# Patient Record
Sex: Male | Born: 1995 | Race: Black or African American | Hispanic: No | Marital: Single | Smoking: Never smoker
Health system: Southern US, Community
[De-identification: ages and names within clinical notes are randomized; demographics above are authoritative.]

---
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
## 2015-05-22 ENCOUNTER — Emergency Department
Admission: EM | Admit: 2015-05-22 | Discharge: 2015-05-22 | Disposition: A | Discharge status: Home / Self Care | Payer: Self-pay | Attending: Emergency Medicine | Admitting: Emergency Medicine

## 2015-05-22 ENCOUNTER — Emergency Department: Payer: Self-pay

## 2015-05-22 ENCOUNTER — Encounter: Payer: Self-pay | Admitting: *Deleted

## 2015-05-22 DIAGNOSIS — S66912A Strain of unspecified muscle, fascia and tendon at wrist and hand level, left hand, initial encounter: Secondary | ICD-10-CM | POA: Insufficient documentation

## 2015-05-22 DIAGNOSIS — R202 Paresthesia of skin: Secondary | ICD-10-CM | POA: Insufficient documentation

## 2015-05-22 DIAGNOSIS — W228XXA Striking against or struck by other objects, initial encounter: Secondary | ICD-10-CM | POA: Insufficient documentation

## 2015-05-22 DIAGNOSIS — Y998 Other external cause status: Secondary | ICD-10-CM | POA: Insufficient documentation

## 2015-05-22 DIAGNOSIS — Y92003 Bedroom of unspecified non-institutional (private) residence as the place of occurrence of the external cause: Secondary | ICD-10-CM | POA: Insufficient documentation

## 2015-05-22 DIAGNOSIS — Y9389 Activity, other specified: Secondary | ICD-10-CM | POA: Insufficient documentation

## 2015-05-22 MED ORDER — IBUPROFEN 600 MG PO TABS
600.0000 mg | ORAL_TABLET | Freq: Four times a day (QID) | ORAL | Status: DC | PRN
Start: 2015-05-22 — End: 2015-11-05

## 2015-05-22 MED ORDER — IBUPROFEN 600 MG PO TABS
600.0000 mg | ORAL_TABLET | Freq: Once | ORAL | Status: AC
  Administered 2015-05-22: 600 mg via ORAL
  Filled 2015-05-22: qty 1

## 2015-05-22 NOTE — ED Notes (Signed)
Patient states he punched his bedroom wall with his left hand

## 2015-05-22 NOTE — ED Provider Notes (Signed)
[facility] Emergency Department Provider Note ____________________________________________  Time seen: Approximately 9:45 PM  I have reviewed the triage vital signs and the nursing notes.   HISTORY  Chief Complaint Hand Injury    HPI Vernon Higgins is a 20 y.o. male who reports to the emergency department for evaluation of left hand pain. He states that he punched his bedroom wall and is now having pain in his wrist, numbness and tingling in his index finger, and pain in his wrist if he turns his hand over.He denies previous fracture. He reviewed reports he is right-handed. He has not taken anything for pain since the incident.  History reviewed. No pertinent past medical history.  There are no active problems to display for this patient.   History reviewed. No pertinent past surgical history.  Current Outpatient Rx  Name  Route  Sig  Dispense  Refill  . ibuprofen (ADVIL,MOTRIN) 600 MG tablet   Oral   Take 1 tablet (600 mg total) by mouth every 6 (six) hours as needed.   30 tablet   0     Allergies Review of patient's allergies indicates no known allergies.  No family history on file.  Social History Social History  Substance Use Topics  . Smoking status: Never Smoker   . Smokeless tobacco: None  . Alcohol Use: No    Review of Systems Constitutional: No recent illness. Musculoskeletal: Pain in Left wrist and fingers. Skin: Negative for laceration, abrasion, or contusion. Neurological: Negative for headaches, focal weakness or numbness. Positive for paresthesia of the left index finger.  ____________________________________________   PHYSICAL EXAM:  VITAL SIGNS: ED Triage Vitals  Enc Vitals Group     BP 05/22/15 2120 110/94 mmHg     Pulse Rate 05/22/15 2120 78     Resp --      Temp 05/22/15 2120 98.3 F (36.8 C)     Temp Source 05/22/15 2120 Oral     SpO2 05/22/15 2120 100 %     Weight --      Height --      Head Cir --      Peak Flow --      Pain Score 05/22/15 2121 8     Pain Loc --      Pain Edu? --      Excl. in GC? --     Constitutional: Alert and oriented. Well appearing and in no acute distress. Eyes: Conjunctivae are normal. EOMI. Head: Atraumatic. Neck: No stridor.  Respiratory: Normal respiratory effort.   Musculoskeletal: Tenderness over the volar aspect of the ulnar styloid area. Tenderness over the dorsal aspect of the distal radius. Normal range of motion of fingers of the left hand. No obvious deformity of the left wrist or hand. Neurologic:  Normal speech and language. No gross focal neurologic deficits are appreciated. Speech is normal. No gait instability. Skin:  Skin is warm, dry and intact. Atraumatic. Psychiatric: Mood and affect are normal. Speech and behavior are normal.  ____________________________________________   LABS (all labs ordered are listed, but only abnormal results are displayed)  Labs Reviewed - No data to display ____________________________________________  RADIOLOGY  Left hand and wrist negative for fracture. I, Vernon Higgins, personally viewed and evaluated these images (plain radiographs) as part of my medical decision making, as well as reviewing the written report by the radiologist.  ____________________________________________   PROCEDURES  Procedure(s) performed:  Velcro wrist splint applied by RN. Neurovascularly intact post application.   ____________________________________________  INITIAL IMPRESSION / ASSESSMENT AND PLAN / ED COURSE  Pertinent labs & imaging results that were available during my care of the patient were reviewed by me and considered in my medical decision making (see chart for details).  Patient and mother were advised to follow-up with orthopedics for symptoms that are not improving over the next 7 days. They were advised to give ibuprofen 600 mg every 6 hours as needed for pain. There were advised to return to the  emergency department for symptoms that change or worsen if they're unable schedule an appointment. ____________________________________________   FINAL CLINICAL IMPRESSION(S) / ED DIAGNOSES  Final diagnoses:  Wrist strain, left, initial encounter  Left hand paresthesia       Vernon Pester, FNP 05/22/15 2247  Rockne Menghini, MD 05/23/15 1844

## 2015-11-05 ENCOUNTER — Emergency Department
Admission: EM | Admit: 2015-11-05 | Discharge: 2015-11-05 | Disposition: A | Discharge status: Home / Self Care | Payer: Self-pay | Attending: Emergency Medicine | Admitting: Emergency Medicine

## 2015-11-05 ENCOUNTER — Emergency Department: Payer: Self-pay

## 2015-11-05 ENCOUNTER — Encounter: Payer: Self-pay | Admitting: Emergency Medicine

## 2015-11-05 DIAGNOSIS — Z87891 Personal history of nicotine dependence: Secondary | ICD-10-CM | POA: Insufficient documentation

## 2015-11-05 DIAGNOSIS — R1013 Epigastric pain: Secondary | ICD-10-CM

## 2015-11-05 DIAGNOSIS — K5900 Constipation, unspecified: Secondary | ICD-10-CM | POA: Insufficient documentation

## 2015-11-05 DIAGNOSIS — K219 Gastro-esophageal reflux disease without esophagitis: Secondary | ICD-10-CM | POA: Insufficient documentation

## 2015-11-05 LAB — COMPREHENSIVE METABOLIC PANEL
ALK PHOS: 48 U/L (ref 38–126)
ALT: 26 U/L (ref 17–63)
ANION GAP: 4 — AB (ref 5–15)
AST: 22 U/L (ref 15–41)
Albumin: 4.1 g/dL (ref 3.5–5.0)
BUN: 13 mg/dL (ref 6–20)
CALCIUM: 8.9 mg/dL (ref 8.9–10.3)
CO2: 28 mmol/L (ref 22–32)
Chloride: 106 mmol/L (ref 101–111)
Creatinine, Ser: 0.95 mg/dL (ref 0.61–1.24)
GFR calc non Af Amer: >60 mL/min (ref >60)
Glucose, Bld: 94 mg/dL (ref 65–99)
POTASSIUM: 3.9 mmol/L (ref 3.5–5.1)
Sodium: 138 mmol/L (ref 135–145)
TOTAL PROTEIN: 6.9 g/dL (ref 6.5–8.1)
Total Bilirubin: 0.4 mg/dL (ref 0.3–1.2)

## 2015-11-05 LAB — CBC
HEMATOCRIT: 41.9 % (ref 40.0–52.0)
HEMOGLOBIN: 14.7 g/dL (ref 13.0–18.0)
MCH: 31.5 pg (ref 26.0–34.0)
MCHC: 35.2 g/dL (ref 32.0–36.0)
MCV: 89.4 fL (ref 80.0–100.0)
Platelets: 236 10*3/uL (ref 150–440)
RBC: 4.69 MIL/uL (ref 4.40–5.90)
RDW: 12.7 % (ref 11.5–14.5)
WBC: 7.5 10*3/uL (ref 3.8–10.6)

## 2015-11-05 LAB — LIPASE, BLOOD: Lipase: 28 U/L (ref 11–51)

## 2015-11-05 LAB — TROPONIN I

## 2015-11-05 MED ORDER — RANITIDINE HCL 150 MG/10ML PO SYRP: 150.0000 mg | ORAL_SOLUTION | Freq: Two times a day (BID) | ORAL | 0 refills | Status: AC

## 2015-11-05 MED ORDER — ONDANSETRON 4 MG PO TBDP
4.0000 mg | ORAL_TABLET | Freq: Once | ORAL | Status: AC
  Administered 2015-11-05: 4 mg via ORAL
  Filled 2015-11-05: qty 1

## 2015-11-05 MED ORDER — GI COCKTAIL ~~LOC~~
30.0000 mL | Freq: Once | ORAL | Status: AC
  Administered 2015-11-05: 30 mL via ORAL
  Filled 2015-11-05: qty 30

## 2015-11-05 MED ORDER — POLYETHYLENE GLYCOL 3350 17 G PO PACK: 17.0000 g | PACK | Freq: Every day | ORAL | 0 refills | Status: AC

## 2015-11-05 NOTE — ED Provider Notes (Signed)
[facility] Emergency Department Provider Note        Time seen: ----------------------------------------- 9:30 AM on 11/05/2015 -----------------------------------------    I have reviewed the triage vital signs and the nursing notes.   HISTORY  Chief Complaint Abdominal Pain    HPI Vernon Higgins is a 20 y.o. male who presents to ER for upper abdominal pain. Patientstates swallowing anything seems to worsen his symptoms. He denies fevers or chills, denies diarrhea. Patient states he's not had these types of problems before. Denies recent alcohol or change in diet.   History reviewed. No pertinent past medical history.  There are no active problems to display for this patient.   History reviewed. No pertinent surgical history.  Allergies Review of patient's allergies indicates no known allergies.  Social History Social History  Substance Use Topics  . Smoking status: Former Games developer  . Smokeless tobacco: Not on file  . Alcohol use No    Review of Systems Constitutional: Negative for fever. Cardiovascular: Negative for chest pain. Respiratory: Negative for shortness of breath. Gastrointestinal: Positive for abdominal pain, nausea Genitourinary: Negative for dysuria. Musculoskeletal: Negative for back pain. Skin: Negative for rash. Neurological: Negative for headaches, focal weakness or numbness.  10-point ROS otherwise negative.  ____________________________________________   PHYSICAL EXAM:  VITAL SIGNS: ED Triage Vitals [11/05/15 0751]  Enc Vitals Group     BP 121/64     Pulse Rate (!) 53     Resp 18     Temp 97.9 F (36.6 C)     Temp Source Oral     SpO2 100 %     Weight 160 lb (72.6 kg)     Height 5\' 7"  (1.702 m)     Head Circumference      Peak Flow      Pain Score      Pain Loc      Pain Edu?      Excl. in GC?     Constitutional: Alert and oriented. Well appearing and in no distress. Eyes: Conjunctivae are  normal. PERRL. Normal extraocular movements. ENT   Head: Normocephalic and atraumatic.   Nose: No congestion/rhinnorhea.   Mouth/Throat: Mucous membranes are moist.   Neck: No stridor. Cardiovascular: Normal rate, regular rhythm. No murmurs, rubs, or gallops. Respiratory: Normal respiratory effort without tachypnea nor retractions. Breath sounds are clear and equal bilaterally. No wheezes/rales/rhonchi. Gastrointestinal: Epigastric tenderness, no rebound or guarding. Normal bowel sounds. Musculoskeletal: Nontender with normal range of motion in all extremities. No lower extremity tenderness nor edema. Neurologic:  Normal speech and language. No gross focal neurologic deficits are appreciated.  Skin:  Skin is warm, dry and intact. No rash noted. Psychiatric: Mood and affect are normal. Speech and behavior are normal.  ____________________________________________  ED COURSE:  Pertinent labs & imaging results that were available during my care of the patient were reviewed by me and considered in my medical decision making (see chart for details). Clinical Course  Patient presents to ER distress, we will assess with basic labs, give GI cocktail and reevaluate.  Procedures ____________________________________________   LABS (pertinent positives/negatives)  Labs Reviewed  COMPREHENSIVE METABOLIC PANEL - Abnormal; Notable for the following:       Result Value   Anion gap 4 (*)    All other components within normal limits  LIPASE, BLOOD  CBC  TROPONIN I    RADIOLOGY  Abdomen 2 view IMPRESSION: 1. Moderate stool burden within the colon which may reflect underlying constipation.  ____________________________________________  FINAL ASSESSMENT AND PLAN  Abdominal pain  Plan: Patient with labs and imaging as dictated above. Patient's no distress, I will start him on Pepcid for GERD and MiraLAX for constipation. He is stable for outpatient follow-up with his  doctor.   Emily FilbertWilliams, Clement Deneault E, MD   Note: This dictation was prepared with Dragon dictation. Any transcriptional errors that result from this process are unintentional    Emily FilbertJonathan E Opha Mcghee, MD 11/05/15 1047

## 2016-08-26 ENCOUNTER — Emergency Department
Admission: EM | Admit: 2016-08-26 | Discharge: 2016-08-26 | Disposition: A | Discharge status: Home / Self Care | Payer: Self-pay | Attending: Emergency Medicine | Admitting: Emergency Medicine

## 2016-08-26 ENCOUNTER — Encounter: Payer: Self-pay | Admitting: Emergency Medicine

## 2016-08-26 DIAGNOSIS — R55 Syncope and collapse: Secondary | ICD-10-CM

## 2016-08-26 DIAGNOSIS — Z87891 Personal history of nicotine dependence: Secondary | ICD-10-CM | POA: Insufficient documentation

## 2016-08-26 LAB — CBC
HEMATOCRIT: 46.8 % (ref 40.0–52.0)
HEMOGLOBIN: 16.2 g/dL (ref 13.0–18.0)
MCH: 31 pg (ref 26.0–34.0)
MCHC: 34.6 g/dL (ref 32.0–36.0)
MCV: 89.7 fL (ref 80.0–100.0)
Platelets: 250 10*3/uL (ref 150–440)
RBC: 5.22 MIL/uL (ref 4.40–5.90)
RDW: 12.8 % (ref 11.5–14.5)
WBC: 13.7 10*3/uL — ABNORMAL HIGH (ref 3.8–10.6)

## 2016-08-26 LAB — BASIC METABOLIC PANEL
ANION GAP: 10 (ref 5–15)
BUN: 18 mg/dL (ref 6–20)
CALCIUM: 9.9 mg/dL (ref 8.9–10.3)
CHLORIDE: 105 mmol/L (ref 101–111)
CO2: 25 mmol/L (ref 22–32)
Creatinine, Ser: 1.18 mg/dL (ref 0.61–1.24)
GFR calc non Af Amer: >60 mL/min (ref >60)
GLUCOSE: 91 mg/dL (ref 65–99)
POTASSIUM: 3.9 mmol/L (ref 3.5–5.1)
Sodium: 140 mmol/L (ref 135–145)

## 2016-08-26 LAB — GLUCOSE, CAPILLARY: GLUCOSE-CAPILLARY: 71 mg/dL (ref 65–99)

## 2016-08-26 MED ORDER — IBUPROFEN 800 MG PO TABS
800.0000 mg | ORAL_TABLET | Freq: Once | ORAL | Status: AC
  Administered 2016-08-26: 800 mg via ORAL
  Filled 2016-08-26: qty 1

## 2016-08-26 MED ORDER — SODIUM CHLORIDE 0.9 % IV BOLUS (SEPSIS)
1000.0000 mL | Freq: Once | INTRAVENOUS | Status: AC
  Administered 2016-08-26: 1000 mL via INTRAVENOUS

## 2016-08-26 NOTE — ED Notes (Addendum)
Near syncopal episode while at work. Pt works in Designer, industrial/producthigh heat environment. Pt alert and oriented X4, active, cooperative, pt in NAD. RR even and unlabored, color WNL.    EMS initiated IV and gave pt 500cc of fluid.

## 2016-08-26 NOTE — ED Provider Notes (Signed)
[facility] Emergency Department Provider Note ____________________________________________   I have reviewed the triage vital signs and the triage nursing note.  HISTORY  Chief Complaint Near Syncope   Historian Patient and brother Emergency planning/management officer(manager with him at work)  HPI Vernon Higgins is a 21 y.o. male no significant past medical history presents today from his place of work which is essentially away her house which is an air-conditioned where he was feeling very hot and over heated and then started feeling lightheaded and dizzy and nearly passed out. No true loss of consciousness. Brother who is also his manager was there and was pouring water on him and ice packs. At this point he is feeling much better and was initially. No chest pain or trouble breathing. He did have some vomiting on Saturday, couple of days ago, but no ongoing feeling of nausea or diarrhea. No tick bites or fevers. No focal weakness or numbness. No headache. No chest pain. No abdominal pain.  Symptoms are now significantly improved.    History reviewed. No pertinent past medical history.  There are no active problems to display for this patient.   History reviewed. No pertinent surgical history.  Prior to Admission medications   Medication Sig Start Date End Date Taking? Authorizing Provider  polyethylene glycol (MIRALAX) packet Take 17 g by mouth daily. Patient not taking: Reported on 08/26/2016 11/05/15   Emily FilbertWilliams, Jonathan E, MD  ranitidine (ZANTAC) 150 MG/10ML syrup Take 10 mLs (150 mg total) by mouth 2 (two) times daily. Patient not taking: Reported on 08/26/2016 11/05/15   Emily FilbertWilliams, Jonathan E, MD    No Known Allergies  History reviewed. No pertinent family history.  Social History Social History  Substance Use Topics  . Smoking status: Former Games developermoker  . Smokeless tobacco: Never Used  . Alcohol use No    Review of Systems  Constitutional: Negative for fever. Eyes: Negative for  visual changes. ENT: Negative for sore throat. Cardiovascular: Negative for chest pain. Respiratory: Negative for shortness of breath. Gastrointestinal: Nausea and vomiting on Saturday, none today. Genitourinary: Negative for dysuria. Musculoskeletal: Negative for back pain. Skin: Negative for rash. Neurological: Negative for headache.  ____________________________________________   PHYSICAL EXAM:  VITAL SIGNS: ED Triage Vitals  Enc Vitals Group     BP 08/26/16 1000 (!) 148/75     Pulse Rate 08/26/16 1000 63     Resp 08/26/16 1000 (!) 9     Temp 08/26/16 1001 98.2 F (36.8 C)     Temp Source 08/26/16 1001 Oral     SpO2 08/26/16 1000 100 %     Weight 08/26/16 1003 155 lb (70.3 kg)     Height 08/26/16 1003 5' 7.5" (1.715 m)     Head Circumference --      Peak Flow --      Pain Score --      Pain Loc --      Pain Edu? --      Excl. in GC? --      Constitutional: Alert and oriented. Well appearing and in no distress. HEENT   Head: Normocephalic and atraumatic.      Eyes: Conjunctivae are normal. Pupils equal and round.       Ears:         Nose: No congestion/rhinnorhea.   Mouth/Throat: Mucous membranes are mildly dry.   Neck: No stridor. Cardiovascular/Chest: Normal rate, regular rhythm.  No murmurs, rubs, or gallops. Respiratory: Normal respiratory effort without tachypnea nor retractions. Breath  sounds are clear and equal bilaterally. No wheezes/rales/rhonchi. Gastrointestinal: Soft. No distention, no guarding, no rebound. Nontender.    Genitourinary/rectal:Deferred Musculoskeletal: Nontender with normal range of motion in all extremities. No joint effusions.  No lower extremity tenderness.  No edema. Neurologic:  Normal speech and language. No gross or focal neurologic deficits are appreciated. Skin:  Skin is warm, dry and intact. No rash noted. Psychiatric: Mood and affect are normal. Speech and behavior are normal. Patient exhibits appropriate insight and  judgment.   ____________________________________________  LABS (pertinent positives/negatives)  Labs Reviewed  CBC - Abnormal; Notable for the following:       Result Value   WBC 13.7 (*)    All other components within normal limits  BASIC METABOLIC PANEL  GLUCOSE, CAPILLARY  URINALYSIS, COMPLETE (UACMP) WITH MICROSCOPIC  URINE DRUG SCREEN, QUALITATIVE ([facility] ONLY)  CBG MONITORING, ED    ____________________________________________    EKG I, Governor Rooks, MD, the attending physician have personally viewed and interpreted all ECGs.  60 bpm. Normal sinus rhythm. Narrow QRS. Normal axis. Normal ST and T-wave ____________________________________________  RADIOLOGY All Xrays were viewed by me. Imaging interpreted by Radiologist.  None __________________________________________  PROCEDURES  Procedure(s) performed: None  Critical Care performed: None  ____________________________________________   ED COURSE / ASSESSMENT AND PLAN  Pertinent labs & imaging results that were available during my care of the patient were reviewed by me and considered in my medical decision making (see chart for details).   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 His vital signs are stable here and is feeling better now. I am going go ahead and give him a liter fluid given the fact that he looks a little dehydrated clinically. His laboratory studies are reassuring.  He had been a little ill a couple days ago with vomiting and although he felt that it subsided, it's possible this made him a little unclear/susceptible to overheating and presyncope.  No high risk features in terms of full syncope, EKG looks reassuring, and the chest pain.  Patient feels improved I am going to go ahead and discharge home and refer her for outpatient follow-up.  Patient did not want to stay to provide urine sample adult tick that this is  unreasonable, is not having symptoms a year urine tract infection. I discussed that if he is using any sort of drugs that could make him more susceptible to presyncope in the setting of overheating.  CONSULTATIONS:  None  Patient / Family / Caregiver informed of clinical course, medical decision-making process, and agree with plan.   I discussed return precautions, follow-up instructions, and discharge instructions with patient and/or family.  Discharge Instructions:  You were evaluated after nearly passing out in the setting of peak over heated. Make sure that you are drinking plenty of water and taking breaks to be cooled down when you working in the heat.  Please follow up with primary care doctor within 1 week. Next an excellent return to the emergency department immediately for any new or worsening symptoms including any chest pain, palpitations, irregular heartbeat, weakness, numbness, confusion altered mental status, or any other symptoms concerning to you.   ___________________________________________   FINAL CLINICAL IMPRESSION(S) / ED DIAGNOSES   Final diagnoses:  Near syncope              Note: This dictation was prepared with Dragon dictation. Any transcriptional errors that result from this process are unintentional  Governor Rooks, MD 08/26/16 1159

## 2016-08-26 NOTE — ED Triage Notes (Signed)
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 EMS reports that pt was working, felt sweaty and had near-syncopal episode, as well as tingling in hands and feet. Warehouse personnel had ice packs on pt to cool him off when ems arrived. EMS also reports irregular HR on EKG. Pt states that did not drink alcohol or do anything out of the ordinary last night and felt normal upon awakening this morning. Pt alert & oriented with NAD noted.  EMS inserted 20g iv catheter in left ac; gave 500 LR en route.

## 2016-08-26 NOTE — Discharge Instructions (Signed)
You were evaluated after nearly passing out in the setting of peak over heated. Make sure that you are drinking plenty of water and taking breaks to be "down when you working in the heat.  Please follow up with primary care doctor within 1 week. Next an excellent return to the emergency department immediately for any new or worsening symptoms including any chest pain, palpitations, irregular heartbeat, weakness, numbness, confusion altered mental status, or any other symptoms concerning to you.

## 2016-11-06 ENCOUNTER — Emergency Department
Admission: EM | Admit: 2016-11-06 | Discharge: 2016-11-06 | Disposition: A | Discharge status: Home / Self Care | Payer: Self-pay | Attending: Emergency Medicine | Admitting: Emergency Medicine

## 2016-11-06 DIAGNOSIS — Y990 Civilian activity done for income or pay: Secondary | ICD-10-CM | POA: Insufficient documentation

## 2016-11-06 DIAGNOSIS — Y9389 Activity, other specified: Secondary | ICD-10-CM | POA: Insufficient documentation

## 2016-11-06 DIAGNOSIS — Z87891 Personal history of nicotine dependence: Secondary | ICD-10-CM | POA: Insufficient documentation

## 2016-11-06 DIAGNOSIS — S46911A Strain of unspecified muscle, fascia and tendon at shoulder and upper arm level, right arm, initial encounter: Secondary | ICD-10-CM | POA: Insufficient documentation

## 2016-11-06 DIAGNOSIS — T148XXA Other injury of unspecified body region, initial encounter: Secondary | ICD-10-CM

## 2016-11-06 DIAGNOSIS — X509XXA Other and unspecified overexertion or strenuous movements or postures, initial encounter: Secondary | ICD-10-CM | POA: Insufficient documentation

## 2016-11-06 DIAGNOSIS — Y9289 Other specified places as the place of occurrence of the external cause: Secondary | ICD-10-CM | POA: Insufficient documentation

## 2016-11-06 MED ORDER — IBUPROFEN 100 MG/5ML PO SUSP: 200.0000 mg | Freq: Four times a day (QID) | ORAL | 0 refills | Status: DC | PRN

## 2016-11-06 MED ORDER — ORPHENADRINE CITRATE 30 MG/ML IJ SOLN
60.0000 mg | Freq: Two times a day (BID) | INTRAMUSCULAR | Status: DC
  Administered 2016-11-06: 60 mg via INTRAMUSCULAR
  Filled 2016-11-06: qty 2

## 2016-11-06 MED ORDER — CYCLOBENZAPRINE HCL 5 MG PO TABS: 5.0000 mg | ORAL_TABLET | Freq: Three times a day (TID) | ORAL | 0 refills | Status: AC | PRN

## 2016-11-06 MED ORDER — KETOROLAC TROMETHAMINE 30 MG/ML IJ SOLN
30.0000 mg | Freq: Once | INTRAMUSCULAR | Status: AC
  Administered 2016-11-06: 30 mg via INTRAVENOUS
  Filled 2016-11-06: qty 1

## 2016-11-06 NOTE — ED Provider Notes (Signed)
[facility] Emergency Department Provider Note  ____________________________________________  Time seen: Approximately 12:03 PM  I have reviewed the triage vital signs and the nursing notes.   HISTORY  Chief Complaint Shoulder Pain and Wrist Pain    HPI Vernon Higgins is a 21 y.o. male that presents to the emergency department with right shoulder pain. 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 Pain is primarily in the back of his shoulder. It feels like a pulled muscle and a burning sensation. He does not have any pain when sitting still. No trauma. He is requesting a work note for today and tomorrow. No neck pain, shortness of breath, chest pain, numbness, tingling.   History reviewed. No pertinent past medical history.  There are no active problems to display for this patient.   History reviewed. No pertinent surgical history.  Prior to Admission medications   Medication Sig Start Date End Date Taking? Authorizing Provider  cyclobenzaprine (FLEXERIL) 5 MG tablet Take 1 tablet (5 mg total) by mouth 3 (three) times daily as needed for muscle spasms. 11/06/16 11/13/16  Enid Derry, PA-C  ibuprofen (ADVIL,MOTRIN) 100 MG/5ML suspension Take 10 mLs (200 mg total) by mouth every 6 (six) hours as needed. 11/06/16   Enid Derry, PA-C  polyethylene glycol [facility]) packet Take 17 g by mouth daily. Patient not taking: Reported on 08/26/2016 11/05/15   Emily Filbert, MD  ranitidine (ZANTAC) 150 MG/10ML syrup Take 10 mLs (150 mg total) by mouth 2 (two) times daily. Patient not taking: Reported on 08/26/2016 11/05/15   Emily Filbert, MD    Allergies Patient has no known allergies.  No family history on file.  Social History Social History  Substance Use Topics  . Smoking status: Former Games developer  . Smokeless tobacco: Never Used  . Alcohol use No     Review of Systems  Constitutional: No fever/chills Cardiovascular: No chest  pain. Respiratory: No SOB. Gastrointestinal: No abdominal pain.  No nausea, no vomiting.  Musculoskeletal: Positive for shoulder pain. Skin: Negative for rash, abrasions, lacerations, ecchymosis. Neurological: Negative for headaches, numbness or tingling   ____________________________________________   PHYSICAL EXAM:  VITAL SIGNS: ED Triage Vitals [11/06/16 1107]  Enc Vitals Group     BP 118/70     Pulse Rate (!) 56     Resp 14     Temp 97.7 F (36.5 C)     Temp Source Oral     SpO2 100 %     Weight 170 lb (77.1 kg)     Height  (1.702 m)     Head Circumference      Peak Flow      Pain Score 0     Pain Loc      Pain Edu?      Excl. in GC?      Constitutional: Alert and oriented. Well appearing and in no acute distress. Eyes: Conjunctivae are normal. PERRL. EOMI. Head: Atraumatic. ENT:      Ears:      Nose: No congestion/rhinnorhea.      Mouth/Throat: Mucous membranes are moist.  Neck: No stridor.  Cardiovascular: Normal rate, regular rhythm.  Good peripheral circulation. 2+ radial pulses. Respiratory: Normal respiratory effort without tachypnea or retractions. Lungs CTAB. Good air entry to the bases with no decreased or absent breath sounds. Musculoskeletal: Full range of motion to all extremities. No gross deformities appreciated. No tenderness to palpation over right shoulder. Strength 5 out of 5 in  shoulder. Sensation intact. Neurologic:  Normal speech and language. No gross focal neurologic deficits are appreciated.  Skin:  Skin is warm, dry and intact. No rash noted.   ____________________________________________   LABS (all labs ordered are listed, but only abnormal results are displayed)  Labs Reviewed - No data to display ____________________________________________  EKG   ____________________________________________  RADIOLOGY  No results found.  ____________________________________________    PROCEDURES  Procedure(s) performed:     Procedures    Medications  orphenadrine (NORFLEX) injection 60 mg (60 mg Intramuscular Given 11/06/16 1233)  ketorolac (TORADOL) 30 MG/ML injection 30 mg (30 mg Intravenous Given 11/06/16 1233)     ____________________________________________   INITIAL IMPRESSION / ASSESSMENT AND PLAN / ED COURSE  Pertinent labs & imaging results that were available during my care of the patient were reviewed by me and considered in my medical decision making (see chart for details).  Review of the [place] CSRS was performed in accordance of the [facility] prior to dispensing any controlled drugs.    Patient presented to the emergency department for right shoulder pain. Symptoms are consistent with an overuse injury. Vital signs and exam are reassuring. Patient states that he is here for a work note to rest his shoulder. Patient will be discharged home with prescriptions for ibuprofen and Flexeril. Patient is to follow up with PCP as directed. Patient is given ED precautions to return to the ED for any worsening or new symptoms.     ____________________________________________  FINAL CLINICAL IMPRESSION(S) / ED DIAGNOSES  Final diagnoses:  Muscle strain      NEW MEDICATIONS STARTED DURING THIS VISIT:  New Prescriptions   CYCLOBENZAPRINE (FLEXERIL) 5 MG TABLET    Take 1 tablet (5 mg total) by mouth 3 (three) times daily as needed for muscle spasms.   IBUPROFEN (ADVIL,MOTRIN) 100 MG/5ML SUSPENSION    Take 10 mLs (200 mg total) by mouth every 6 (six) hours as needed.        This chart was dictated using voice recognition software/Dragon. Despite best efforts to proofread, errors can occur which can change the meaning. Any change was purely unintentional.    Enid DerryWagner, Juanito Gonyer, PA-C 11/06/16 1324    Minna AntisPaduchowski, Kevin, MD 11/06/16 1555

## 2016-11-06 NOTE — ED Triage Notes (Signed)
Says pain right shouler and left wrist with movement.  Denies injurye

## 2016-11-06 NOTE — ED Notes (Signed)
See triage note  presents with pain to right shoulder and left wrist  W/o known injury.  States the discomfort to shoulder started yesterday and he reached back and popped his shoulder   Which made it feel better  But at work today states pain returned

## 2017-03-09 ENCOUNTER — Emergency Department
Admission: EM | Admit: 2017-03-09 | Discharge: 2017-03-09 | Disposition: A | Discharge status: Home / Self Care | Payer: Self-pay | Attending: Emergency Medicine | Admitting: Emergency Medicine

## 2017-03-09 ENCOUNTER — Encounter: Payer: Self-pay | Admitting: Emergency Medicine

## 2017-03-09 ENCOUNTER — Other Ambulatory Visit: Payer: Self-pay

## 2017-03-09 DIAGNOSIS — Z87891 Personal history of nicotine dependence: Secondary | ICD-10-CM | POA: Insufficient documentation

## 2017-03-09 DIAGNOSIS — M654 Radial styloid tenosynovitis [de Quervain]: Secondary | ICD-10-CM | POA: Insufficient documentation

## 2017-03-09 MED ORDER — KETOROLAC TROMETHAMINE 30 MG/ML IJ SOLN
30.0000 mg | Freq: Once | INTRAMUSCULAR | Status: AC
  Administered 2017-03-09: 30 mg via INTRAMUSCULAR
  Filled 2017-03-09: qty 1

## 2017-03-09 MED ORDER — IBUPROFEN 800 MG PO TABS: 800.0000 mg | ORAL_TABLET | Freq: Three times a day (TID) | ORAL | 0 refills | Status: DC | PRN

## 2017-03-09 NOTE — ED Provider Notes (Signed)
[facility] Emergency Department Provider Note  ____________________________________________  Time seen: Approximately 10:20 AM  I have reviewed the triage vital signs and the nursing notes.   HISTORY  Chief Complaint Hand Pain    HPI Vernon Higgins is a 22 y.o. male that presents emergency department for evaluation of right thumb pain for 1 week.  Pain is over the outside of his thumb near his wrist.  It is worse with movement of his thumb.  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  He tried ice and Advil last night, which did not help.  No trauma.  No swelling, numbness, tingling.   History reviewed. No pertinent past medical history.  There are no active problems to display for this patient.   History reviewed. No pertinent surgical history.  Prior to Admission medications   Medication Sig Start Date End Date Taking? Authorizing Provider  ibuprofen (ADVIL,MOTRIN) 800 MG tablet Take 1 tablet (800 mg total) by mouth every 8 (eight) hours as needed. 03/09/17   Enid DerryWagner, Enmanuel Zufall, PA-C  polyethylene glycol [facility](MIRALAX) packet Take 17 g by mouth daily. Patient not taking: Reported on 08/26/2016 11/05/15   Emily FilbertWilliams, Jonathan E, MD  ranitidine (ZANTAC) 150 MG/10ML syrup Take 10 mLs (150 mg total) by mouth 2 (two) times daily. Patient not taking: Reported on 08/26/2016 11/05/15   Emily FilbertWilliams, Jonathan E, MD    Allergies Patient has no known allergies.  No family history on file.  Social History Social History   Tobacco Use  . Smoking status: Former Games developermoker  . Smokeless tobacco: Never Used  Substance Use Topics  . Alcohol use: No  . Drug use: Not on file     Review of Systems  Cardiovascular: No chest pain. Respiratory: No SOB. Gastrointestinal: No nausea, no vomiting.  Musculoskeletal: Positive for thumb pain. Skin: Negative for rash, abrasions, lacerations, ecchymosis. Neurological: Negative for numbness or  tingling   ____________________________________________   PHYSICAL EXAM:  VITAL SIGNS: ED Triage Vitals  Enc Vitals Group     BP 03/09/17 0939 131/69     Pulse Rate 03/09/17 0939 (!) 59     Resp 03/09/17 0939 16     Temp 03/09/17 0939 97.8 F (36.6 C)     Temp Source 03/09/17 0939 Oral     SpO2 03/09/17 0939 100 %     Weight 03/09/17 0938 180 lb (81.6 kg)     Height 03/09/17 0938 5\' 7"  (1.702 m)     Head Circumference --      Peak Flow --      Pain Score 03/09/17 0938 6     Pain Loc --      Pain Edu? --      Excl. in GC? --      Constitutional: Alert and oriented. Well appearing and in no acute distress. Eyes: Conjunctivae are normal. PERRL. EOMI. Head: Atraumatic. ENT:      Ears:      Nose: No congestion/rhinnorhea.      Mouth/Throat: Mucous membranes are moist.  Neck: No stridor.   Cardiovascular: Good peripheral circulation.  Symmetric radial pulses bilaterally. Respiratory: Normal respiratory effort without tachypnea or retractions.  Good air entry to the bases with no decreased or absent breath sounds. Musculoskeletal: Full range of motion to all extremities. No gross deformities appreciated.  No tenderness to palpation over wrist.  Grip strength intact.  Positive Finkelstein's test.  No swelling or erythema. Neurologic:  Normal speech and language. No  gross focal neurologic deficits are appreciated.  Skin:  Skin is warm, dry and intact. No rash noted.   ____________________________________________   LABS (all labs ordered are listed, but only abnormal results are displayed)  Labs Reviewed - No data to display ____________________________________________  EKG   ____________________________________________  RADIOLOGY  No results found.  ____________________________________________    PROCEDURES  Procedure(s) performed:    Procedures    Medications  ketorolac (TORADOL) 30 MG/ML injection 30 mg (30 mg Intramuscular Given 03/09/17 1026)      ____________________________________________   INITIAL IMPRESSION / ASSESSMENT AND PLAN / ED COURSE  Pertinent labs & imaging results that were available during my care of the patient were reviewed by me and considered in my medical decision making (see chart for details).  Review of the [place] CSRS was performed in accordance of the [facility] prior to dispensing any controlled drugs.   Patient's diagnosis is consistent with de Quervain's.  Vital signs and exam are reassuring.  Patient was given a dose of Toradol.  Wrist splint was placed placed.  Patient will be discharged home with prescriptions for ibuprofen. Patient is to follow up with PCP as directed. Patient is given ED precautions to return to the ED for any worsening or new symptoms.     ____________________________________________  FINAL CLINICAL IMPRESSION(S) / ED DIAGNOSES  Final diagnoses:  De Quervain's tenosynovitis      NEW MEDICATIONS STARTED DURING THIS VISIT:  ED Discharge Orders        Ordered    ibuprofen (ADVIL,MOTRIN) 800 MG tablet  Every 8 hours PRN     03/09/17 1025          This chart was dictated using voice recognition software/Dragon. Despite best efforts to proofread, errors can occur which can change the meaning. Any change was purely unintentional.    Enid Derry, PA-C 03/09/17 1203    Jene Every, MD 03/09/17 1229

## 2017-03-09 NOTE — ED Notes (Addendum)
Right wrist and thumb pain for the past week, pt states he may have hit hand at work and states from overusing hand thumb continues to to hurt down through the wrist.  Pt states he has put ice on it and took a dose of Advil for home treatment.

## 2017-03-09 NOTE — ED Triage Notes (Signed)
C/O right hand pain x 10 days.  Unsure of injury, states pain worse when working.

## 2017-11-24 ENCOUNTER — Other Ambulatory Visit: Payer: Self-pay

## 2017-11-24 DIAGNOSIS — Z79899 Other long term (current) drug therapy: Secondary | ICD-10-CM | POA: Insufficient documentation

## 2017-11-24 DIAGNOSIS — Z87891 Personal history of nicotine dependence: Secondary | ICD-10-CM | POA: Insufficient documentation

## 2017-11-24 DIAGNOSIS — K219 Gastro-esophageal reflux disease without esophagitis: Secondary | ICD-10-CM | POA: Insufficient documentation

## 2017-11-24 LAB — COMPREHENSIVE METABOLIC PANEL
ALT: 12 U/L (ref 0–44)
ANION GAP: 7 (ref 5–15)
AST: 18 U/L (ref 15–41)
Albumin: 4.2 g/dL (ref 3.5–5.0)
Alkaline Phosphatase: 39 U/L (ref 38–126)
BUN: 13 mg/dL (ref 6–20)
CO2: 25 mmol/L (ref 22–32)
CREATININE: 0.93 mg/dL (ref 0.61–1.24)
Calcium: 8.9 mg/dL (ref 8.9–10.3)
Chloride: 107 mmol/L (ref 98–111)
Glucose, Bld: 124 mg/dL — ABNORMAL HIGH (ref 70–99)
Potassium: 3.9 mmol/L (ref 3.5–5.1)
SODIUM: 139 mmol/L (ref 135–145)
Total Bilirubin: 0.5 mg/dL (ref 0.3–1.2)
Total Protein: 6.6 g/dL (ref 6.5–8.1)

## 2017-11-24 LAB — CBC
HCT: 40.6 % (ref 40.0–52.0)
Hemoglobin: 14.5 g/dL (ref 13.0–18.0)
MCH: 33.1 pg (ref 26.0–34.0)
MCHC: 35.7 g/dL (ref 32.0–36.0)
MCV: 92.7 fL (ref 80.0–100.0)
PLATELETS: 245 10*3/uL (ref 150–440)
RBC: 4.37 MIL/uL — AB (ref 4.40–5.90)
RDW: 13.2 % (ref 11.5–14.5)
WBC: 7.8 10*3/uL (ref 3.8–10.6)

## 2017-11-24 LAB — LIPASE, BLOOD: LIPASE: 36 U/L (ref 11–51)

## 2017-11-24 NOTE — ED Triage Notes (Signed)
Pt in with co epigastric pain and vomiting after he eats. States started Friday, states pain is like a burning that worsens after he eats.

## 2017-11-25 ENCOUNTER — Emergency Department: Payer: Self-pay

## 2017-11-25 ENCOUNTER — Emergency Department
Admission: EM | Admit: 2017-11-25 | Discharge: 2017-11-25 | Disposition: A | Discharge status: Home / Self Care | Payer: Self-pay | Attending: Emergency Medicine | Admitting: Emergency Medicine

## 2017-11-25 DIAGNOSIS — K219 Gastro-esophageal reflux disease without esophagitis: Secondary | ICD-10-CM

## 2017-11-25 LAB — URINALYSIS, COMPLETE (UACMP) WITH MICROSCOPIC
Bilirubin Urine: NEGATIVE
Glucose, UA: NEGATIVE mg/dL
Hgb urine dipstick: NEGATIVE
KETONES UR: NEGATIVE mg/dL
Leukocytes, UA: NEGATIVE
NITRITE: NEGATIVE
PROTEIN: NEGATIVE mg/dL
Specific Gravity, Urine: 1.018 (ref 1.005–1.030)
pH: 7 (ref 5.0–8.0)

## 2017-11-25 LAB — TROPONIN I: Troponin I: <0.03 ng/mL (ref <0.03)

## 2017-11-25 MED ORDER — RANITIDINE HCL 150 MG/10ML PO SYRP
150.0000 mg | ORAL_SOLUTION | Freq: Once | ORAL | Status: DC
  Filled 2017-11-25: qty 10

## 2017-11-25 MED ORDER — RANITIDINE HCL 300 MG PO TABS: 300.0000 mg | ORAL_TABLET | Freq: Every day | ORAL | 0 refills | Status: DC

## 2017-11-25 MED ORDER — PANTOPRAZOLE SODIUM 40 MG PO TBEC
DELAYED_RELEASE_TABLET | ORAL | Status: AC
  Administered 2017-11-25: 40 mg via ORAL
  Filled 2017-11-25: qty 1

## 2017-11-25 MED ORDER — PANTOPRAZOLE SODIUM 40 MG PO TBEC
40.0000 mg | DELAYED_RELEASE_TABLET | Freq: Once | ORAL | Status: AC
  Administered 2017-11-25: 40 mg via ORAL

## 2017-11-25 NOTE — ED Provider Notes (Addendum)
[facility] Emergency Department Provider Note    First MD Initiated Contact with Patient 11/25/17 0117     (approximate)  I have reviewed the triage vital signs and the nursing notes.   HISTORY  Chief Complaint Abdominal Pain    HPI Vernon Higgins is a 22 y.o. male resents to the emergency department with epigastric abdominal pain which patient states is worsened with eating. Patient also admits to vomiting after each meal. She describes the pain as burning. Patient denies any pain at present.   Past medical history None There are no active problems to display for this patient.   Past surgical history None  Prior to Admission medications   Medication Sig Start Date End Date Taking? Authorizing Provider  ibuprofen (ADVIL,MOTRIN) 800 MG tablet Take 1 tablet (800 mg total) by mouth every 8 (eight) hours as needed. 03/09/17   Enid Derry, PA-C  polyethylene glycol [facility]) packet Take 17 g by mouth daily. Patient not taking: Reported on 08/26/2016 11/05/15   Emily Filbert, MD  ranitidine (ZANTAC) 150 MG/10ML syrup Take 10 mLs (150 mg total) by mouth 2 (two) times daily. Patient not taking: Reported on 08/26/2016 11/05/15   Emily Filbert, MD    Allergies No known drug allergies No family history on file.  Social History Social History   Tobacco Use  . Smoking status: Former Games developer  . Smokeless tobacco: Never Used  Substance Use Topics  . Alcohol use: No  . Drug use: Not on file    Review of Systems Constitutional: No fever/chills Eyes: No visual changes. ENT: No sore throat. Cardiovascular: Denies chest pain. Respiratory: Denies shortness of breath. Gastrointestinal: Positive for burning abdominal pain.  No nausea, no vomiting.  No diarrhea.  No constipation. Genitourinary: Negative for dysuria. Musculoskeletal: Negative for neck pain.  Negative for back pain. Integumentary: Negative for rash. Neurological: Negative for  headaches, focal weakness or numbness.  ____________________________________________   PHYSICAL EXAM:  VITAL SIGNS: ED Triage Vitals  Enc Vitals Group     BP 11/24/17 2141 117/77     Pulse Rate 11/24/17 2141 (!) 53     Resp 11/24/17 2141 20     Temp 11/24/17 2141 98.4 F (36.9 C)     Temp Source 11/24/17 2141 Oral     SpO2 11/24/17 2141 100 %     Weight 11/24/17 2140 81.6 kg (180 lb)     Height 11/24/17 2140 1.702 m (5\' 7" )     Head Circumference --      Peak Flow --      Pain Score 11/24/17 2140 7     Pain Loc --      Pain Edu? --      Excl. in GC? --     Constitutional: Alert and oriented. Well appearing and in no acute distress. Eyes: Conjunctivae are normal. Mouth/Throat: Mucous membranes are moist.Oropharynx non-erythematous. Neck: No stridor.  Cardiovascular: Normal rate, regular rhythm. Good peripheral circulation. Grossly normal heart sounds. Respiratory: Normal respiratory effort.  No retractions. Lungs CTAB. Gastrointestinal: Soft and nontender. No distention.  Musculoskeletal: No lower extremity tenderness nor edema. No gross deformities of extremities. Neurologic:  Normal speech and language. No gross focal neurologic deficits are appreciated.  Skin:  Skin is warm, dry and intact. No rash noted.   ____________________________________________   LABS (all labs ordered are listed, but only abnormal results are displayed)  Labs Reviewed  CBC - Abnormal; Notable for the following components:  Result Value   RBC 4.37 (*)    All other components within normal limits  COMPREHENSIVE METABOLIC PANEL - Abnormal; Notable for the following components:   Glucose, Bld 124 (*)    All other components within normal limits  URINALYSIS, COMPLETE (UACMP) WITH MICROSCOPIC - Abnormal; Notable for the following components:   Color, Urine YELLOW (*)    APPearance CLOUDY (*)    Bacteria, UA FEW (*)    All other components within normal limits  LIPASE, BLOOD  TROPONIN  I   ____________________________________________   RADIOLOGY I, Sy[place] N Kaytie Ratcliffe, personally viewed and evaluated these images (plain radiographs) as part of my medical decision making, as well as reviewing the written report by the radiologist.  ED MD interpretation: No evidence of acute cardiopulmonary disease per radiologist  Official radiology report(s): Dg Chest Portable 1 View  Result Date: 11/25/2017 CLINICAL DATA:  Epigastric/chest pain, vomiting after eating EXAM: PORTABLE CHEST 1 VIEW COMPARISON:  03/06/2008 FINDINGS: Lungs are clear.  No pleural effusion or pneumothorax. The heart is normal in size. IMPRESSION: No evidence of acute cardiopulmonary disease. Electronically Signed   By: Charline Bills M.D.   On: 11/25/2017 01:06        Procedures   ____________________________________________   INITIAL IMPRESSION / ASSESSMENT AND PLAN / ED COURSE  As part of my medical decision making, I reviewed the following data within the electronic MEDICAL RECORD NUMBER   22 year old male presenting with above-stated history and physical exam of burning epigastric pain radiating to the chest.  Suspect possible gastritis, optic ulcer versus GERD as the etiology for the patient's discomfort patient has no pain at present.  Patient will be prescribed Zantac for home with recommendation to follow-up with gastroenterology.    ____________________________________________  FINAL CLINICAL IMPRESSION(S) / ED DIAGNOSES  Final diagnoses:  Gastroesophageal reflux disease, esophagitis presence not specified     MEDICATIONS GIVEN DURING THIS VISIT:  Medications - No data to display   ED Discharge Orders    None       Note:  This document was prepared using Dragon voice recognition software and may include unintentional dictation errors.    Darci Current, MD 11/25/17 1610    Darci Current, MD 12/09/17 [phone]

## 2017-11-25 NOTE — ED Notes (Signed)
Pt uprite on stretcher in exam room with no distress noted; pt reports since Friday having midsternal, upper "burning" CP accomp by N/V; pt placed on card monitor for EKG and troponin added to previous collection; resp even/unlab, lungs clear, apical audible & regular, +BS, abd soft/nondist/nontender

## 2018-01-05 ENCOUNTER — Emergency Department: Payer: Self-pay

## 2018-01-05 ENCOUNTER — Encounter: Payer: Self-pay | Admitting: Emergency Medicine

## 2018-01-05 ENCOUNTER — Emergency Department
Admission: EM | Admit: 2018-01-05 | Discharge: 2018-01-05 | Disposition: A | Discharge status: Home / Self Care | Payer: Self-pay | Attending: Emergency Medicine | Admitting: Emergency Medicine

## 2018-01-05 DIAGNOSIS — J069 Acute upper respiratory infection, unspecified: Secondary | ICD-10-CM | POA: Insufficient documentation

## 2018-01-05 DIAGNOSIS — Z87891 Personal history of nicotine dependence: Secondary | ICD-10-CM | POA: Insufficient documentation

## 2018-01-05 LAB — URINALYSIS, COMPLETE (UACMP) WITH MICROSCOPIC
Bacteria, UA: NONE SEEN
Bilirubin Urine: NEGATIVE
GLUCOSE, UA: NEGATIVE mg/dL
HGB URINE DIPSTICK: NEGATIVE
Ketones, ur: NEGATIVE mg/dL
Leukocytes, UA: NEGATIVE
NITRITE: NEGATIVE
PH: 6 (ref 5.0–8.0)
Protein, ur: NEGATIVE mg/dL
Specific Gravity, Urine: 1.015 (ref 1.005–1.030)
Squamous Epithelial / LPF: NONE SEEN (ref 0–5)

## 2018-01-05 LAB — CBC
HCT: 43 % (ref 39.0–52.0)
HEMOGLOBIN: 14.9 g/dL (ref 13.0–17.0)
MCH: 31.6 pg (ref 26.0–34.0)
MCHC: 34.7 g/dL (ref 30.0–36.0)
MCV: 91.3 fL (ref 80.0–100.0)
PLATELETS: 267 10*3/uL (ref 150–400)
RBC: 4.71 MIL/uL (ref 4.22–5.81)
RDW: 11.9 % (ref 11.5–15.5)
WBC: 7.7 10*3/uL (ref 4.0–10.5)
nRBC: 0 % (ref 0.0–0.2)

## 2018-01-05 LAB — COMPREHENSIVE METABOLIC PANEL
ALBUMIN: 4.3 g/dL (ref 3.5–5.0)
ALK PHOS: 45 U/L (ref 38–126)
ALT: 12 U/L (ref 0–44)
ANION GAP: 8 (ref 5–15)
AST: 17 U/L (ref 15–41)
BILIRUBIN TOTAL: 0.7 mg/dL (ref 0.3–1.2)
BUN: 11 mg/dL (ref 6–20)
CALCIUM: 9.3 mg/dL (ref 8.9–10.3)
CO2: 27 mmol/L (ref 22–32)
Chloride: 105 mmol/L (ref 98–111)
Creatinine, Ser: 0.76 mg/dL (ref 0.61–1.24)
GFR calc Af Amer: >60 mL/min (ref >60)
GLUCOSE: 85 mg/dL (ref 70–99)
POTASSIUM: 4 mmol/L (ref 3.5–5.1)
Sodium: 140 mmol/L (ref 135–145)
TOTAL PROTEIN: 7 g/dL (ref 6.5–8.1)

## 2018-01-05 LAB — LIPASE, BLOOD: Lipase: 38 U/L (ref 11–51)

## 2018-01-05 LAB — TROPONIN I: Troponin I: <0.03 ng/mL (ref <0.03)

## 2018-01-05 MED ORDER — ONDANSETRON 4 MG PO TBDP: 4.0000 mg | ORAL_TABLET | Freq: Three times a day (TID) | ORAL | 0 refills | Status: DC | PRN

## 2018-01-05 NOTE — ED Triage Notes (Signed)
Pt reports feeling weak for the last 3 days with some abd pain, chest pain, nausea, vomiting and weakness.

## 2018-01-05 NOTE — ED Provider Notes (Signed)
[facility] Emergency Department Provider Note  Time seen: 9:42 PM  I have reviewed the triage vital signs and the nursing notes.   HISTORY  Chief Complaint Weakness; Emesis; Nausea; Chest Pain; and Abdominal Pain    HPI Vernon Higgins is a 22 y.o. male with no past medical history who presents emergency department for 1 week of cough, congestion, muscle aches, nausea and vomiting.  According to the patient for the past 1 week he has had a cough and congestion, occasional with nausea vomiting and upper abdominal discomfort which she relates to the vomiting.  Denies any known fever.  Does state some muscle aching/body aches.  Patient states try to go to work but vomited to he came to the emergency department for evaluation.   History reviewed. No pertinent past medical history.  There are no active problems to display for this patient.   History reviewed. No pertinent surgical history.  Prior to Admission medications   Medication Sig Start Date End Date Taking? Authorizing Provider  ibuprofen (ADVIL,MOTRIN) 800 MG tablet Take 1 tablet (800 mg total) by mouth every 8 (eight) hours as needed. 03/09/17   Enid DerryWagner, Ashley, PA-C  polyethylene glycol [facility](MIRALAX) packet Take 17 g by mouth daily. Patient not taking: Reported on 08/26/2016 11/05/15   Emily FilbertWilliams, Jonathan E, MD  ranitidine (ZANTAC) 150 MG/10ML syrup Take 10 mLs (150 mg total) by mouth 2 (two) times daily. Patient not taking: Reported on 08/26/2016 11/05/15   Emily FilbertWilliams, Jonathan E, MD  ranitidine (ZANTAC) 300 MG tablet Take 1 tablet (300 mg total) by mouth at bedtime. 11/25/17 12/25/17  Darci CurrentBrown, Pajonal N, MD    No Known Allergies  No family history on file.  Social History Social History   Tobacco Use  . Smoking status: Former Games developermoker  . Smokeless tobacco: Never Used  Substance Use Topics  . Alcohol use: No  . Drug use: Not on file    Review of Systems Constitutional: Negative for fever. ENT: Positive for  congestion x1 week Cardiovascular: Negative for chest pain. Respiratory: Negative for shortness of breath.  Positive for cough. Gastrointestinal: Intermittent upper abdominal discomfort.  Occasional nausea and vomiting.  Negative for diarrhea Genitourinary: Negative for urinary compaints Musculoskeletal: Negative for musculoskeletal complaints Skin: Negative for skin complaints  Neurological: Negative for headache All other ROS negative  ____________________________________________   PHYSICAL EXAM:  VITAL SIGNS: ED Triage Vitals [01/05/18 2051]  Enc Vitals Group     BP 126/75     Pulse Rate 73     Resp 20     Temp 97.8 F (36.6 C)     Temp Source Oral     SpO2 100 %     Weight 195 lb (88.5 kg)     Height 5\' 7"  (1.702 m)     Head Circumference      Peak Flow      Pain Score 8     Pain Loc      Pain Edu?      Excl. in GC?     Constitutional: Alert and oriented. Well appearing and in no distress. Eyes: Normal exam ENT   Head: Normocephalic and atraumatic.   Nose: Mild congestion   Mouth/Throat: Mucous membranes are moist. Cardiovascular: Normal rate, regular rhythm. No murmur Respiratory: Normal respiratory effort without tachypnea nor retractions. Breath sounds are clear Gastrointestinal: Soft and nontender. No distention.  Musculoskeletal: Nontender with normal range of motion in all extremities.  Neurologic:  Normal speech and language. No  gross focal neurologic deficits  Skin:  Skin is warm, dry and intact.  Psychiatric: Mood and affect are normal.   ____________________________________________    EKG  EKG shows a normal sinus rhythm at 62 bpm with a narrow QRS, normal axis, normal intervals, no concerning ST changes.  ____________________________________________    RADIOLOGY  Chest x-ray is negative  ____________________________________________   INITIAL IMPRESSION / ASSESSMENT AND PLAN / ED COURSE  Pertinent labs & imaging results that  were available during my care of the patient were reviewed by me and considered in my medical decision making (see chart for details).  Patient presents to the emergency department for 1 week of cough, congestion, now with intermittent nausea and vomiting and some upper abdominal discomfort.  Overall the patient appears well benign abdominal exam, clear lung sounds, normal vitals.  Patient's lab work, chest x-ray and EKG are reassuring.  Overall the patient appears very well.  Suspect likely viral upper respiratory infection we will discharge with nausea medication.  Instructed the patient to use Tylenol ibuprofen as written on the box as needed for discomfort, along with plenty of rest and fluids.  Patient agreeable to plan of care.  ____________________________________________   FINAL CLINICAL IMPRESSION(S) / ED DIAGNOSES  Upper respiratory infection    Minna Antis, MD 01/05/18 2145

## 2018-07-30 LAB — HM HEPATITIS C SCREENING LAB: HM Hepatitis Screen: NEGATIVE

## 2018-07-30 LAB — HM HIV SCREENING LAB: HM HIV Screening: NEGATIVE

## 2018-12-07 ENCOUNTER — Ambulatory Visit: Payer: Self-pay | Admitting: Physician Assistant

## 2018-12-07 ENCOUNTER — Other Ambulatory Visit: Payer: Self-pay

## 2018-12-07 ENCOUNTER — Encounter: Payer: Self-pay | Admitting: Physician Assistant

## 2018-12-07 DIAGNOSIS — Z113 Encounter for screening for infections with a predominantly sexual mode of transmission: Secondary | ICD-10-CM

## 2018-12-07 LAB — GRAM STAIN

## 2018-12-07 NOTE — Progress Notes (Signed)
    STI clinic/screening visit  Subjective:  Vernon Higgins is a 23 y.o. male being seen today for an STI screening visit. The patient reports they do not have symptoms.  Patient has the following medical conditions:  There are no active problems to display for this patient.    Chief Complaint  Patient presents with  . SEXUALLY TRANSMITTED DISEASE    HPI  Patient reports that he does not have any symptoms and would like a screening today.    See flowsheet for further details and programmatic requirements.    The following portions of the patient's history were reviewed and updated as appropriate: allergies, current medications, past medical history, past social history, past surgical history and problem list.  Objective:  There were no vitals filed for this visit.  Physical Exam Constitutional:      General: He is not in acute distress.    Appearance: Normal appearance.  HENT:     Head: Normocephalic and atraumatic.     Mouth/Throat:     Mouth: Mucous membranes are moist.     Pharynx: No oropharyngeal exudate or posterior oropharyngeal erythema.  Eyes:     Conjunctiva/sclera: Conjunctivae normal.  Neck:     Musculoskeletal: Neck supple.  Pulmonary:     Effort: Pulmonary effort is normal.  Abdominal:     Palpations: Abdomen is soft. There is no mass.     Tenderness: There is no abdominal tenderness. There is no guarding or rebound.  Genitourinary:    Penis: Normal.      Scrotum/Testes: Normal.     Comments: Pubic area without nits, lice, edema, erythema, lesions and inguinal adenopathy. Penis circumcised and without discharge at meatus. Lymphadenopathy:     Cervical: No cervical adenopathy.  Skin:    General: Skin is warm and dry.     Findings: No bruising, erythema, lesion or rash.  Neurological:     Mental Status: He is alert and oriented to person, place, and time.  Psychiatric:        Mood and Affect: Mood normal.        Behavior: Behavior normal.      Thought Content: Thought content normal.        Judgment: Judgment normal.       Assessment and Plan:  Vernon Higgins is a 23 y.o. male presenting to the [facility] Department for STI screening  1. Screening for STD (sexually transmitted disease) Patient into clinic without symptoms. Rec condoms with all sex. Await test results.  Counseled that RN will call if needs to RTC for treatment once results are back. - Gram stain - HIV [place] [facility] - Syphilis Serology, [place] [facility] - Gonococcus culture     No follow-ups on file.  No future appointments.  Jerene Dilling, PA

## 2018-12-12 LAB — GONOCOCCUS CULTURE

## 2019-01-18 ENCOUNTER — Ambulatory Visit: Payer: Medicaid Other | Admitting: Physician Assistant

## 2019-01-18 ENCOUNTER — Other Ambulatory Visit: Payer: Self-pay

## 2019-01-18 DIAGNOSIS — R198 Other specified symptoms and signs involving the digestive system and abdomen: Secondary | ICD-10-CM

## 2019-01-18 DIAGNOSIS — Z113 Encounter for screening for infections with a predominantly sexual mode of transmission: Secondary | ICD-10-CM

## 2019-01-18 DIAGNOSIS — N341 Nonspecific urethritis: Secondary | ICD-10-CM

## 2019-01-18 LAB — GRAM STAIN

## 2019-01-18 MED ORDER — AZITHROMYCIN 500 MG PO TABS: 500.0000 mg | ORAL_TABLET | Freq: Once | ORAL | Status: DC

## 2019-01-18 MED ORDER — AZITHROMYCIN 500 MG PO TABS: 1000.0000 mg | ORAL_TABLET | Freq: Once | ORAL | Status: DC

## 2019-01-18 MED ORDER — AZITHROMYCIN 1 G PO PACK: 1.0000 g | PACK | Freq: Once | ORAL | 0 refills | Status: AC

## 2019-01-19 ENCOUNTER — Encounter: Payer: Self-pay | Admitting: Physician Assistant

## 2019-01-19 DIAGNOSIS — R198 Other specified symptoms and signs involving the digestive system and abdomen: Secondary | ICD-10-CM | POA: Insufficient documentation

## 2019-01-19 NOTE — Progress Notes (Signed)
STI clinic/screening visit  Subjective:  Vernon Higgins is a 23 y.o. male being seen today for an STI screening visit. The patient reports they do not have symptoms.    Patient has the following medical conditions:  There are no active problems to display for this patient.    Chief Complaint  Patient presents with  . SEXUALLY TRANSMITTED DISEASE    HPI  Patient reports that he is not having symptoms but would like a screening.  Denies chronic conditions and history of surgery.  See flowsheet for further details and programmatic requirements.    The following portions of the patient's history were reviewed and updated as appropriate: allergies, current medications, past medical history, past social history, past surgical history and problem list.  Objective:  There were no vitals filed for this visit.  Physical Exam Constitutional:      General: He is not in acute distress.    Appearance: Normal appearance.  HENT:     Head: Normocephalic and atraumatic.     Comments: No nits, lice, or hair loss. No cervical, supraclavicular or axillary adenopathy.    Mouth/Throat:     Mouth: Mucous membranes are moist.     Pharynx: Oropharynx is clear. No oropharyngeal exudate or posterior oropharyngeal erythema.  Eyes:     Conjunctiva/sclera: Conjunctivae normal.  Neck:     Musculoskeletal: Neck supple.  Pulmonary:     Effort: Pulmonary effort is normal.  Abdominal:     Palpations: Abdomen is soft. There is no mass.     Tenderness: There is no abdominal tenderness. There is no guarding or rebound.  Genitourinary:    Penis: Normal.      Scrotum/Testes: Normal.     Comments: Pubic area without nits, lice, edema, erythema, lesions and inguinal adenopathy. Penis circumcised and without discharge at meatus. Lymphadenopathy:     Cervical: No cervical adenopathy.  Skin:    General: Skin is warm and dry.     Findings: No bruising, erythema, lesion or rash.  Neurological:   Mental Status: He is alert and oriented to person, place, and time.  Psychiatric:        Mood and Affect: Mood normal.        Behavior: Behavior normal.        Thought Content: Thought content normal.        Judgment: Judgment normal.       Assessment and Plan:  Vernon Higgins is a 23 y.o. male presenting to the [facility] Department for STI screening  1. Screening for STD (sexually transmitted disease) Patient into clinic for screening and without symptoms. Rec condoms with all sex.  Await test results.  Counseled that RN will call if needs to RTC for further treatment once results are back.  - Gram stain - HIV [place] [facility] - Syphilis Serology,  Lab - Gonococcus culture  2. 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  Attempted to treat patient with DOT therapy, but patient unable to swallow pills and spit them out in trash.  Patient requested Rx for powder that he can take in applesauce or mix with water and handwritten Rx given to patient for same. No sex for 7 days and until after partner completes treatment. Call clinic if not able to keep down medicine for another Rx. - azithromycin (ZITHROMAX) 1 g powder; Take 1 packet (1 g total) by mouth once for 1 dose.  Dispense: 1 each;  Refill: 0     Return if symptoms worsen or fail to improve.  No future appointments.  Jerene Dilling, PA

## 2019-01-22 LAB — GONOCOCCUS CULTURE

## 2019-05-30 ENCOUNTER — Emergency Department: Payer: Medicaid Other

## 2019-05-30 ENCOUNTER — Other Ambulatory Visit: Payer: Self-pay

## 2019-05-30 ENCOUNTER — Emergency Department
Admission: EM | Admit: 2019-05-30 | Discharge: 2019-05-30 | Disposition: A | Discharge status: Home / Self Care | Payer: Medicaid Other | Attending: Emergency Medicine | Admitting: Emergency Medicine

## 2019-05-30 DIAGNOSIS — X509XXA Other and unspecified overexertion or strenuous movements or postures, initial encounter: Secondary | ICD-10-CM | POA: Insufficient documentation

## 2019-05-30 DIAGNOSIS — S43005A Unspecified dislocation of left shoulder joint, initial encounter: Secondary | ICD-10-CM

## 2019-05-30 DIAGNOSIS — S43015A Anterior dislocation of left humerus, initial encounter: Secondary | ICD-10-CM | POA: Insufficient documentation

## 2019-05-30 DIAGNOSIS — Y999 Unspecified external cause status: Secondary | ICD-10-CM | POA: Insufficient documentation

## 2019-05-30 DIAGNOSIS — Y929 Unspecified place or not applicable: Secondary | ICD-10-CM | POA: Insufficient documentation

## 2019-05-30 DIAGNOSIS — Z87891 Personal history of nicotine dependence: Secondary | ICD-10-CM | POA: Insufficient documentation

## 2019-05-30 DIAGNOSIS — Y9371 Activity, boxing: Secondary | ICD-10-CM | POA: Insufficient documentation

## 2019-05-30 MED ORDER — BUPIVACAINE HCL 0.25 % IJ SOLN
5.0000 mL | Freq: Once | INTRAMUSCULAR | Status: AC
  Administered 2019-05-30: 5 mL
  Filled 2019-05-30: qty 5

## 2019-05-30 MED ORDER — IBUPROFEN 600 MG PO TABS: 600.0000 mg | ORAL_TABLET | Freq: Three times a day (TID) | ORAL | 0 refills | Status: AC | PRN

## 2019-05-30 MED ORDER — HYDROMORPHONE HCL 1 MG/ML IJ SOLN
1.0000 mg | Freq: Once | INTRAMUSCULAR | Status: AC
  Administered 2019-05-30: 1 mg via INTRAVENOUS
  Filled 2019-05-30: qty 1

## 2019-05-30 MED ORDER — HYDROCODONE-ACETAMINOPHEN 5-325 MG PO TABS: 1.0000 | ORAL_TABLET | Freq: Four times a day (QID) | ORAL | 0 refills | Status: DC | PRN

## 2019-05-30 MED ORDER — LIDOCAINE HCL (PF) 1 % IJ SOLN
5.0000 mL | Freq: Once | INTRAMUSCULAR | Status: AC
  Administered 2019-05-30: 5 mL via INTRADERMAL
  Filled 2019-05-30: qty 5

## 2019-05-30 NOTE — ED Provider Notes (Signed)
[facility] Emergency Department Provider Note  ____________________________________________   First MD Initiated Contact with Patient 05/30/19 2146     (approximate)  I have reviewed the triage vital signs and the nursing notes.   HISTORY  Chief Complaint Dislocation    HPI Vernon Higgins is a 24 y.o. male  Here with left shoulder pain. Pt was boxing today when he punched with his left arm, reports immediate onset of severe pain and deformity to shoulder. Pain is aching, throbbing, severe, worse w/ any movement. He has a history of dislocation in the past, did not follow-up with an orthopedist. No direct trauma. No numbness, weakness. No direct trauma.        History reviewed. No pertinent past medical history.  Patient Active Problem List   Diagnosis Date Noted  . Difficulty swallowing pills 01/19/2019    History reviewed. No pertinent surgical history.  Prior to Admission medications   Medication Sig Start Date End Date Taking? Authorizing Provider  HYDROcodone-acetaminophen (NORCO/VICODIN) 5-325 MG tablet Take 1 tablet by mouth every 6 (six) hours as needed for severe pain. 05/30/19 05/29/20  Shaune Pollack, MD  ibuprofen (ADVIL) 600 MG tablet Take 1 tablet (600 mg total) by mouth every 8 (eight) hours as needed for moderate pain. 05/30/19   Shaune Pollack, MD  polyethylene glycol [facility]) packet Take 17 g by mouth daily. Patient not taking: Reported on 08/26/2016 11/05/15   Emily Filbert, MD  ranitidine (ZANTAC) 150 MG/10ML syrup Take 10 mLs (150 mg total) by mouth 2 (two) times daily. Patient not taking: Reported on 08/26/2016 11/05/15   Emily Filbert, MD    Allergies Patient has no known allergies.  No family history on file.  Social History Social History   Tobacco Use  . Smoking status: Former Games developer  . Smokeless tobacco: Never Used  Substance Use Topics  . Alcohol use: No  . Drug use: Yes    Types: Marijuana    Review  of Systems  Review of Systems  Constitutional: Negative for chills and fever.  HENT: Negative for sore throat.   Respiratory: Negative for shortness of breath.   Cardiovascular: Negative for chest pain.  Gastrointestinal: Negative for abdominal pain.  Genitourinary: Negative for flank pain.  Musculoskeletal: Positive for arthralgias. Negative for neck pain.  Skin: Negative for rash and wound.  Allergic/Immunologic: Negative for immunocompromised state.  Neurological: Negative for weakness and numbness.  Hematological: Does not bruise/bleed easily.     ____________________________________________  PHYSICAL EXAM:      VITAL SIGNS: ED Triage Vitals  Enc Vitals Group     BP 05/30/19 2041 130/85     Pulse Rate 05/30/19 2041 70     Resp 05/30/19 2041 18     Temp 05/30/19 2041 98.4 F (36.9 C)     Temp Source 05/30/19 2041 Oral     SpO2 05/30/19 2041 97 %     Weight 05/30/19 2042 180 lb (81.6 kg)     Height 05/30/19 2042 5\' 8"  (1.727 m)     Head Circumference --      Peak Flow --      Pain Score 05/30/19 2048 10     Pain Loc --      Pain Edu? --      Excl. in GC? --      Physical Exam Vitals and nursing note reviewed.  Constitutional:      General: He is not in acute distress.    Appearance:  He is well-developed.  HENT:     Head: Normocephalic and atraumatic.  Eyes:     Conjunctiva/sclera: Conjunctivae normal.  Cardiovascular:     Rate and Rhythm: Normal rate and regular rhythm.     Heart sounds: Normal heart sounds.  Pulmonary:     Effort: Pulmonary effort is normal. No respiratory distress.     Breath sounds: No wheezing.  Abdominal:     General: There is no distension.  Musculoskeletal:     Cervical back: Neck supple.  Skin:    General: Skin is warm.     Capillary Refill: Capillary refill takes less than 2 seconds.     Findings: No rash.  Neurological:     Mental Status: He is alert and oriented to person, place, and time.     Motor: No abnormal muscle  tone.      UPPER EXTREMITY EXAM: LEFT  INSPECTION & PALPATION: Obvious step off deformity of left shoulder with anterior dislocation. No open wounds.   SENSORY: Sensation is intact to light touch in:  Superficial radial nerve distribution (dorsal first web space) Median nerve distribution (tip of index finger)   Ulnar nerve distribution (tip of small finger)   Slight diminished sensation in axillary nerve dist.   MOTOR:  + Motor posterior interosseous nerve (thumb IP extension) + Anterior interosseous nerve (thumb IP flexion, index finger DIP flexion) + Radial nerve (wrist extension) + Median nerve (palpable firing thenar mass) + Ulnar nerve (palpable firing of first dorsal interosseous muscle)  VASCULAR: 2+ radial pulse Brisk capillary refill < 2 sec, fingers warm and well-perfused     ____________________________________________   LABS (all labs ordered are listed, but only abnormal results are displayed)  Labs Reviewed - No data to display  ____________________________________________  EKG:  ________________________________________  RADIOLOGY All imaging, including plain films, CT scans, and ultrasounds, independently reviewed by me, and interpretations confirmed via formal radiology reads.  ED MD interpretation:   XR Shoulder left: Anterior L shoulder dislocation XR Shoulder 2: Successful reduction, HIll-Sachs lesion noted  Official radiology report(s): DG Shoulder Left  Result Date: 05/30/2019 CLINICAL DATA:  Status post reduction. EXAM: LEFT SHOULDER - 2+ VIEW COMPARISON:  05/30/2019 FINDINGS: There is been interval reduction of the previously demonstrated glenohumeral dislocation. A Hill-Sachs lesion is noted. There is no evidence for a bony [place]. IMPRESSION: Interval reduction of the previously demonstrated glenohumeral dislocation with a Hill-Sachs lesion. Electronically Signed   By: Constance Holster M.D.   On: 05/30/2019 22:25   DG Shoulder  Left  Result Date: 05/30/2019 CLINICAL DATA:  Boxing injury, shoulder pain. EXAM: LEFT SHOULDER - 2+ VIEW COMPARISON:  None. FINDINGS: There is anterior dislocation at the left glenohumeral joint. No visible fracture. Soft tissues are intact. IMPRESSION: Anterior left shoulder dislocation. Electronically Signed   By: Rolm Baptise M.D.   On: 05/30/2019 21:08    ____________________________________________  PROCEDURES   Procedure(s) performed (including Critical Care):  .Ortho Injury Treatment  Date/Time: 05/31/2019 12:49 AM Performed by: Duffy Bruce, MD Authorized by: Duffy Bruce, MD   Consent:    Consent obtained:  Verbal   Consent given by:  Patient   Risks discussed:  Fracture, irreducible dislocation, nerve damage, recurrent dislocation, restricted joint movement, stiffness and vascular damage   Alternatives discussed:  Alternative treatmentInjury location: Left shoulder dislocation. Pre-procedure neurovascular assessment: neurovascularly intact Pre-procedure distal perfusion: normal Pre-procedure neurological function: normal Pre-procedure range of motion: reduced Anesthesia method: Intra-articular block.  Anesthesia: Local anesthesia used: yes Local anesthetic:  1:1 lidocaine:bupivicaine. Anesthetic total: 10 mL  Patient sedated: NoPost-procedure neurovascular assessment: post-procedure neurovascularly intact Post-procedure distal perfusion: normal Post-procedure neurological function: normal Post-procedure range of motion: improved Patient tolerance: patient tolerated the procedure well with no immediate complications Comments: Closed manual reduction achieved at bedside, tolerated well.     ____________________________________________  INITIAL IMPRESSION / MDM / ASSESSMENT AND PLAN / ED COURSE  As part of my medical decision making, I reviewed the following data within the electronic MEDICAL RECORD NUMBER Nursing notes reviewed and incorporated, Old chart  reviewed, Notes from prior ED visits, and [place] Controlled Substance Database       *Vernon Higgins was evaluated in Emergency Department on 05/31/2019 for the symptoms described in the history of present illness. He was evaluated in the context of the global COVID-19 pandemic, which necessitated consideration that the patient might be at risk for infection with the SARS-CoV-2 virus that causes COVID-19. Institutional protocols and algorithms that pertain to the evaluation of patients at risk for COVID-19 are in a state of rapid change based on information released by regulatory bodies including the CDC and federal and state organizations. These policies and algorithms were followed during the patient's care in the ED.  Some ED evaluations and interventions may be delayed as a result of limited staffing during the pandemic.*     Medical Decision Making:  24 yo M here with atraumatic left shoulder dislocation. History of same. Following informed consent, pt given dilaudid and local anesthesia with intra-articular injection of 10 cc of 1:1 lidocaine:bupivicaine. Pt tolerated procedure very well and shoulder was able to be reduced with just slight downward traction and external rotation. Concerned he is high risk for recurrent dislocation given the laxity on exam and ease of reduction. XR shows reduction w/ Tamera Reason. Will place in sling, refer for outpt ortho follow-up. Non-weight bearing discussed.   ____________________________________________  FINAL CLINICAL IMPRESSION(S) / ED DIAGNOSES  Final diagnoses:  Shoulder dislocation, left, initial encounter     MEDICATIONS GIVEN DURING THIS VISIT:  Medications  HYDROmorphone (DILAUDID) injection 1 mg (1 mg Intravenous Given 05/30/19 2152)  lidocaine (PF) (XYLOCAINE) 1 % injection 5 mL (5 mLs Intradermal Given by Other 05/30/19 2207)  bupivacaine (MARCAINE) 0.25 % (with pres) injection 5 mL (5 mLs Infiltration Given by Other 05/30/19 2208)     ED  Discharge Orders         Ordered    ibuprofen (ADVIL) 600 MG tablet  Every 8 hours PRN     05/30/19 2226    HYDROcodone-acetaminophen (NORCO/VICODIN) 5-325 MG tablet  Every 6 hours PRN     05/30/19 2232           Note:  This document was prepared using Dragon voice recognition software and may include unintentional dictation errors.   Shaune Pollack, MD 05/31/19 [phone]

## 2019-05-30 NOTE — Discharge Instructions (Addendum)
You should see an Orthopedist in the next 1-2 weeks for follow-up  Wear your sling for the next week, then gently return to normal activity. No heavy lifting > 15 lb for 2 weeks.

## 2019-05-30 NOTE — ED Triage Notes (Signed)
Pt arrives to ED c/o possible left arm dislocation. Pt states he was boxing and threw a punch and feels like he threw his left arm out. Pt states this has happened before. Abnormality noted.

## 2019-05-30 NOTE — ED Notes (Signed)
Pt left arm dislocated.

## 2019-10-24 ENCOUNTER — Encounter: Payer: Self-pay | Admitting: Family Medicine

## 2019-10-24 ENCOUNTER — Other Ambulatory Visit: Payer: Self-pay

## 2019-10-24 ENCOUNTER — Ambulatory Visit: Payer: Self-pay | Admitting: Family Medicine

## 2019-10-24 DIAGNOSIS — Z792 Long term (current) use of antibiotics: Secondary | ICD-10-CM

## 2019-10-24 DIAGNOSIS — Z113 Encounter for screening for infections with a predominantly sexual mode of transmission: Secondary | ICD-10-CM

## 2019-10-24 LAB — GRAM STAIN

## 2019-10-24 MED ORDER — DOXYCYCLINE HYCLATE 100 MG PO TABS: 100.0000 mg | ORAL_TABLET | Freq: Two times a day (BID) | ORAL | 0 refills | Status: AC

## 2019-10-24 MED ORDER — CEFTRIAXONE SODIUM 500 MG IJ SOLR
500.0000 mg | Freq: Once | INTRAMUSCULAR | Status: AC
  Administered 2019-10-24: 500 mg via INTRAMUSCULAR

## 2019-10-24 NOTE — Progress Notes (Signed)
   [facility] Department STI clinic/screening visit  Subjective:  Vernon Higgins is a 24 y.o. male being seen today for an STI screening visit. The patient reports they do have symptoms.    Patient has the following medical conditions:   Patient Active Problem List   Diagnosis Date Noted  . Difficulty swallowing pills 01/19/2019     Chief Complaint  Patient presents with  . SEXUALLY TRANSMITTED DISEASE    screening     HPI  Patient reports discomfort with urination x 5 days.  Patient denies any other symptoms.     See flowsheet for further details and programmatic requirements.    The following portions of the patient's history were reviewed and updated as appropriate: allergies, current medications, past medical history, past social history, past surgical history and problem list.  Objective:  There were no vitals filed for this visit.  Physical Exam Constitutional:      Appearance: Normal appearance.  HENT:     Head: Normocephalic.     Mouth/Throat:     Mouth: Mucous membranes are moist.     Pharynx: Oropharynx is clear.     Comments: dental caries present  Genitourinary:    Penis: Normal.      Testes: Normal.     Comments: No lice, nits, or pest, no lesions or odor.  Denies pain or tenderness with paplation of testicles.  No lesions, ulcers or masses present.  Swollen left inguinal lymph nodes, non-tender and mobile. Thick white discharge present at urethral opening.   Musculoskeletal:     Cervical back: Normal range of motion and neck supple.  Lymphadenopathy:     Cervical: No cervical adenopathy.  Skin:    General: Skin is warm and dry.     Findings: No bruising, erythema, lesion or rash.  Neurological:     Mental Status: He is alert.  Psychiatric:        Mood and Affect: Mood normal.        Behavior: Behavior normal.       Assessment and Plan:  Vernon Higgins is a 24 y.o. male presenting to the [facility] Department for STI  screening  1. Screening examination for venereal disease - Gram stain - HIV [place] [facility] - HBV Antigen/Antibody [facility] - Syphilis Serology, [place] - Gonococcus culture throat  - Gonococcus culture urethral   Per gram stain no treatment needed. Consulted with Maximiano Coss , PA.  Per Maximiano Coss, verbal order Patient treated prophylactically for chlamydia and gonorrhea d/t discharge on exam with Ceftriaxone 500 mg IM and Doxycycline 100 MG PO BID x 7 days.  Instructed patient no sex for next 7 days. Awaiting results of GC and blood work.  Will call patient if any results are positive.  Recommended condoms with all sex.        No follow-ups on file.  Future Appointments  Date Time Provider Department Center  10/24/2019 11:00 AM Wendi Snipes, RN [facility] None    Wendi Snipes, RN

## 2019-10-27 ENCOUNTER — Encounter: Payer: Self-pay | Admitting: Family Medicine

## 2019-10-27 LAB — HEPATITIS B SURFACE ANTIGEN

## 2019-10-29 LAB — GONOCOCCUS CULTURE

## 2019-11-02 ENCOUNTER — Encounter: Payer: Self-pay | Admitting: Family Medicine

## 2019-11-02 LAB — HM HIV SCREENING LAB: HM HIV Screening: NEGATIVE

## 2019-11-02 LAB — HM HEPATITIS C SCREENING LAB: HM Hepatitis Screen: NEGATIVE

## 2019-11-04 ENCOUNTER — Encounter: Payer: Self-pay | Admitting: Family Medicine

## 2019-11-05 NOTE — Progress Notes (Signed)
Reviewed ERRN documentation for flowsheet and exam note.  Agree that documentation meets STD program requirements/guidelines and standing orders.  Will sign sign since ERRN not cleared to sign documents yet. 

## 2020-01-16 ENCOUNTER — Other Ambulatory Visit: Payer: Self-pay

## 2020-01-16 ENCOUNTER — Ambulatory Visit: Payer: Self-pay | Admitting: Physician Assistant

## 2020-01-16 DIAGNOSIS — Z113 Encounter for screening for infections with a predominantly sexual mode of transmission: Secondary | ICD-10-CM

## 2020-01-16 LAB — GRAM STAIN

## 2020-01-16 NOTE — Progress Notes (Signed)
Gram stain reviewed with provider and is negative today, so no treatment needed for gram stain per standing order and per provider verbal order. Provider orders completed.

## 2020-01-17 ENCOUNTER — Encounter: Payer: Self-pay | Admitting: Physician Assistant

## 2020-01-17 NOTE — Progress Notes (Signed)
   [facility] Department STI clinic/screening visit  Subjective:  Vernon Higgins is a 24 y.o. male being seen today for an STI screening visit. The patient reports they do not have symptoms.    Patient has the following medical conditions:   Patient Active Problem List   Diagnosis Date Noted  . Difficulty swallowing pills 01/19/2019     Chief Complaint  Patient presents with  . SEXUALLY TRANSMITTED DISEASE    screening    HPI  Patient reports that he has no symptoms but would like a screening today.  Denies chronic conditions, surgeries and regular medicines.  States last HIV test was in 09/2019 and last void prior to sample collection for Gram stain was over 2 hr ago.   See flowsheet for further details and programmatic requirements.    The following portions of the patient's history were reviewed and updated as appropriate: allergies, current medications, past medical history, past social history, past surgical history and problem list.  Objective:  There were no vitals filed for this visit.  Physical Exam Constitutional:      General: He is not in acute distress.    Appearance: Normal appearance.  HENT:     Head: Normocephalic and atraumatic.     Comments: No nits,lice, or hair loss. No cervical, supraclavicular or axillary adenopathy.    Mouth/Throat:     Mouth: Mucous membranes are moist.     Pharynx: Oropharynx is clear. No oropharyngeal exudate or posterior oropharyngeal erythema.  Eyes:     Conjunctiva/sclera: Conjunctivae normal.  Pulmonary:     Effort: Pulmonary effort is normal.  Abdominal:     Palpations: Abdomen is soft. There is no mass.     Tenderness: There is no abdominal tenderness. There is no guarding or rebound.  Genitourinary:    Penis: Normal.      Testes: Normal.     Comments: Pubic area without nits, lice, hair loss, edema, erythema, lesions and inguinal adenopathy. Penis circumcised without rash, lesions and discharge at  meatus. Musculoskeletal:     Cervical back: Neck supple. No tenderness.  Skin:    General: Skin is warm and dry.     Findings: No bruising, erythema, lesion or rash.  Neurological:     Mental Status: He is alert and oriented to person, place, and time.  Psychiatric:        Mood and Affect: Mood normal.        Behavior: Behavior normal.        Thought Content: Thought content normal.        Judgment: Judgment normal.       Assessment and Plan:  [name] is a 24 y.o. male presenting to the [facility] Department for STI screening  1. Screening for STD (sexually transmitted disease) Patient into clinic without symptoms. Patient declines blood work today. Rec condoms with all sex. Await test results.  Counseled that RN will call if needs to RTC for treatment once results are back. - Gram stain - Gonococcus culture     Return for PRN.  No future appointments.  Matt Holmes, PA

## 2020-01-21 LAB — GONOCOCCUS CULTURE

## 2020-03-27 ENCOUNTER — Emergency Department: Payer: Self-pay

## 2020-03-27 ENCOUNTER — Other Ambulatory Visit: Payer: Self-pay

## 2020-03-27 ENCOUNTER — Emergency Department
Admission: EM | Admit: 2020-03-27 | Discharge: 2020-03-27 | Disposition: A | Discharge status: Home / Self Care | Payer: Self-pay | Attending: Emergency Medicine | Admitting: Emergency Medicine

## 2020-03-27 DIAGNOSIS — S43015A Anterior dislocation of left humerus, initial encounter: Secondary | ICD-10-CM | POA: Insufficient documentation

## 2020-03-27 DIAGNOSIS — X58XXXA Exposure to other specified factors, initial encounter: Secondary | ICD-10-CM | POA: Insufficient documentation

## 2020-03-27 DIAGNOSIS — Z87891 Personal history of nicotine dependence: Secondary | ICD-10-CM | POA: Insufficient documentation

## 2020-03-27 DIAGNOSIS — Y92009 Unspecified place in unspecified non-institutional (private) residence as the place of occurrence of the external cause: Secondary | ICD-10-CM | POA: Insufficient documentation

## 2020-03-27 DIAGNOSIS — Y93B9 Activity, other involving muscle strengthening exercises: Secondary | ICD-10-CM | POA: Insufficient documentation

## 2020-03-27 MED ORDER — ONDANSETRON HCL 4 MG/2ML IJ SOLN
4.0000 mg | Freq: Once | INTRAMUSCULAR | Status: AC
  Administered 2020-03-27: 4 mg via INTRAVENOUS
  Filled 2020-03-27: qty 2

## 2020-03-27 MED ORDER — HYDROMORPHONE HCL 1 MG/ML IJ SOLN
1.0000 mg | Freq: Once | INTRAMUSCULAR | Status: AC
  Administered 2020-03-27: 1 mg via INTRAVENOUS
  Filled 2020-03-27: qty 1

## 2020-03-27 MED ORDER — HYDROCODONE-ACETAMINOPHEN 5-325 MG PO TABS: 1.0000 | ORAL_TABLET | ORAL | 0 refills | Status: AC | PRN

## 2020-03-27 NOTE — ED Notes (Signed)
Took over care of pt. Pt resting comfortably and states shoulder feels much better. Pt in NAD at this time. Will continue to monitor.

## 2020-03-27 NOTE — ED Triage Notes (Signed)
Pt arrives [facility] w cc of L shoulder pain. Pt reports stretching arm during video game and heard a pop. Possible dislocation. Hx dislocations.

## 2020-03-27 NOTE — ED Provider Notes (Signed)
[facility] Emergency Department Provider Note  Time seen: 10:13 PM  I have reviewed the triage vital signs and the nursing notes.   HISTORY  Chief Complaint Shoulder Pain   HPI Vernon Higgins is a 25 y.o. male with no past medical history presents to the emergency department for left shoulder pain.  According to the patient he was at home stretching when he felt his shoulder pop out.  States he has dislocated the shoulder previously and had to come to the emergency department to have it reduced.  Denies any trauma.  No other medical complaints.  Negative review of systems otherwise.   History reviewed. No pertinent past medical history.  Patient Active Problem List   Diagnosis Date Noted  . Difficulty swallowing pills 01/19/2019    History reviewed. No pertinent surgical history.  Prior to Admission medications   Medication Sig Start Date End Date Taking? Authorizing Provider  HYDROcodone-acetaminophen (NORCO/VICODIN) 5-325 MG tablet Take 1 tablet by mouth every 6 (six) hours as needed for severe pain. 05/30/19 05/29/20  Shaune Pollack, MD  ibuprofen (ADVIL) 600 MG tablet Take 1 tablet (600 mg total) by mouth every 8 (eight) hours as needed for moderate pain. 05/30/19   Shaune Pollack, MD  polyethylene glycol [facility]) packet Take 17 g by mouth daily. Patient not taking: Reported on 08/26/2016 11/05/15   Emily Filbert, MD  ranitidine (ZANTAC) 150 MG/10ML syrup Take 10 mLs (150 mg total) by mouth 2 (two) times daily. Patient not taking: Reported on 08/26/2016 11/05/15   Emily Filbert, MD    No Known Allergies  No family history on file.  Social History Social History   Tobacco Use  . Smoking status: Former Games developer  . Smokeless tobacco: Never Used  Vaping Use  . Vaping Use: Never used  Substance Use Topics  . Alcohol use: No  . Drug use: Yes    Frequency: 7.0 times per week    Types: Marijuana    Review of Systems Constitutional: Negative  for fever. Cardiovascular: Negative for chest pain. Respiratory: Negative for shortness of breath. Gastrointestinal: Negative for abdominal pain Musculoskeletal: Left shoulder pain Neurological: Negative for headache All other ROS negative  ____________________________________________   PHYSICAL EXAM:  VITAL SIGNS: ED Triage Vitals  Enc Vitals Group     BP 03/27/20 2147 (!) 146/92     Pulse Rate 03/27/20 2149 88     Resp 03/27/20 2147 18     Temp 03/27/20 2149 98 F (36.7 C)     Temp Source 03/27/20 2149 Oral     SpO2 03/27/20 2149 99 %     Weight 03/27/20 2147 180 lb (81.6 kg)     Height 03/27/20 2147 5\' 8"  (1.727 m)     Head Circumference --      Peak Flow --      Pain Score 03/27/20 2145 10     Pain Loc --      Pain Edu? --      Excl. in GC? --    Constitutional: Alert and oriented.  Mild distress due to shoulder pain Eyes: Normal exam ENT      Head: Normocephalic and atraumatic.      Mouth/Throat: Mucous membranes are moist. Cardiovascular: Normal rate, regular rhythm.  Respiratory: Normal respiratory effort without tachypnea nor retractions. Breath sounds are clear Gastrointestinal: Soft and nontender. No distention Musculoskeletal: Left shoulder is lowered and appears deformed.  Tender to palpation.  Neuro vastly intact distally. Neurologic:  Normal speech and language. No gross focal neurologic deficits  Skin:  Skin is warm, dry and intact.  Psychiatric: Mood and affect are normal.   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 Repeat x-ray shows successful reduction.  ____________________________________________   INITIAL IMPRESSION / ASSESSMENT AND PLAN / ED COURSE  Pertinent labs & imaging results that were available during my care of the patient were reviewed by me and considered in my medical decision making (see chart for details).   Patient presents  emergency department for left shoulder pain that occurred while stretching.  Patient's shoulder appears lower and deformed consistent with likely dislocation. X-ray confirms dislocation.  We will treat pain and attempt reduction in the emergency department.  Reduction of dislocation Date/Time: 11:12 PM Performed by: Minna Antis Authorized by: Minna Antis Consent: Verbal consent obtained. Risks and benefits: risks, benefits and alternatives were discussed Consent given by: patient Required items: required blood products, implants, devices, and special equipment available Time out: Immediately prior to procedure a "time out" was called to verify the correct patient, procedure, equipment, support staff and site/side marked as required.  Patient sedated: No  Vitals: Vital signs were monitored during sedation. Patient tolerance: Patient tolerated the procedure well with no immediate complications. Joint: Left shoulder Reduction technique: traction and scapular manipulation   Vernon Higgins was evaluated in Emergency Department on 03/27/2020 for the symptoms described in the history of present illness. He was evaluated in the context of the global COVID-19 pandemic, which necessitated consideration that the patient might be at risk for infection with the SARS-CoV-2 virus that causes COVID-19. Institutional protocols and algorithms that pertain to the evaluation of patients at risk for COVID-19 are in a state of rapid change based on information released by regulatory bodies including the CDC and federal and state organizations. These policies and algorithms were followed during the patient's care in the ED.  ____________________________________________   FINAL CLINICAL IMPRESSION(S) / ED DIAGNOSES  Left shoulder dislocation   Minna Antis, MD 03/27/20 2313

## 2020-03-29 ENCOUNTER — Other Ambulatory Visit: Payer: Self-pay

## 2020-03-29 ENCOUNTER — Ambulatory Visit: Payer: Self-pay | Admitting: Physician Assistant

## 2020-03-29 DIAGNOSIS — Z113 Encounter for screening for infections with a predominantly sexual mode of transmission: Secondary | ICD-10-CM

## 2020-03-30 LAB — GRAM STAIN

## 2020-03-31 ENCOUNTER — Encounter: Payer: Self-pay | Admitting: Physician Assistant

## 2020-03-31 NOTE — Progress Notes (Signed)
   [facility] Department STI clinic/screening visit  Subjective:  Vernon Higgins is a 25 y.o. male being seen today for an STI screening visit. The patient reports they do not have symptoms.    Patient has the following medical conditions:   Patient Active Problem List   Diagnosis Date Noted  . Difficulty swallowing pills 01/19/2019     Chief Complaint  Patient presents with  . SEXUALLY TRANSMITTED DISEASE    screening    HPI  Patient reports that he is not having any symptoms but would like a screening today.  Denies chronic conditions, surgeries and regular medicines.  Reports that his last HIV test was 09/2019 and last void prior to sample collection for Gram stain was over 2 hr ago.   See flowsheet for further details and programmatic requirements.    The following portions of the patient's history were reviewed and updated as appropriate: allergies, current medications, past medical history, past social history, past surgical history and problem list.  Objective:  There were no vitals filed for this visit.  Physical Exam Constitutional:      General: He is not in acute distress.    Appearance: Normal appearance.  HENT:     Head: Normocephalic and atraumatic.     Comments: No nits,lice, or hair loss. No cervical, supraclavicular or axillary adenopathy.    Mouth/Throat:     Mouth: Mucous membranes are moist.     Pharynx: Oropharynx is clear. No oropharyngeal exudate or posterior oropharyngeal erythema.  Eyes:     Conjunctiva/sclera: Conjunctivae normal.  Pulmonary:     Effort: Pulmonary effort is normal.  Abdominal:     Palpations: Abdomen is soft. There is no mass.     Tenderness: There is no abdominal tenderness. There is no guarding or rebound.  Genitourinary:    Penis: Normal.      Testes: Normal.     Comments: Pubic area without nits, lice, hair loss, edema, erythema, lesions and inguinal adenopathy. Penis circumcised without rash, lesions and  discharge at meatus. Musculoskeletal:     Cervical back: Neck supple. No tenderness.  Skin:    General: Skin is warm and dry.     Findings: No bruising, erythema, lesion or rash.  Neurological:     Mental Status: He is alert and oriented to person, place, and time.  Psychiatric:        Mood and Affect: Mood normal.        Behavior: Behavior normal.        Thought Content: Thought content normal.        Judgment: Judgment normal.       Assessment and Plan:  Vernon Higgins is a 25 y.o. male presenting to the [facility] Department for STI screening  1. Screening for STD (sexually transmitted disease) Patient into clinic without symptoms. Patient declines blood work today. Reviewed with patient Gram stain and that no treatment is indicated today. Per patient request, ROI signed and copy of blue sheet with Gram stain results reviewed with and given to patient. Rec condoms with all sex. Await test results.  Counseled that RN will call if needs to RTC for treatment once results are back. - Gram stain - Gonococcus culture - Gonococcus culture     No follow-ups on file.  No future appointments.  Matt Holmes, PA

## 2020-04-03 LAB — GONOCOCCUS CULTURE

## 2020-09-10 ENCOUNTER — Ambulatory Visit: Payer: Medicaid Other

## 2020-09-11 ENCOUNTER — Encounter: Payer: Self-pay | Admitting: Family Medicine

## 2020-09-11 ENCOUNTER — Other Ambulatory Visit: Payer: Self-pay

## 2020-09-11 ENCOUNTER — Ambulatory Visit: Payer: Self-pay | Admitting: Family Medicine

## 2020-09-11 DIAGNOSIS — Z113 Encounter for screening for infections with a predominantly sexual mode of transmission: Secondary | ICD-10-CM

## 2020-09-11 DIAGNOSIS — Z202 Contact with and (suspected) exposure to infections with a predominantly sexual mode of transmission: Secondary | ICD-10-CM

## 2020-09-11 LAB — GRAM STAIN

## 2020-09-11 MED ORDER — DOXYCYCLINE HYCLATE 100 MG PO TABS: 100.0000 mg | ORAL_TABLET | Freq: Two times a day (BID) | ORAL | 0 refills | Status: AC

## 2020-09-11 NOTE — Progress Notes (Signed)
   [facility] Department STI clinic/screening visit  Subjective:  Vernon Higgins is a 25 y.o. male being seen today for an STI screening visit. The patient reports they do have symptoms.    Patient has the following medical conditions:   Patient Active Problem List   Diagnosis Date Noted   Difficulty swallowing pills 01/19/2019     Chief Complaint  Patient presents with   SEXUALLY TRANSMITTED DISEASE    STD screening except bloodwork. Desires tx for contact to chlamydia.    HPI  Patient reports contact to chlamydia, has some s/sx    See flowsheet for further details and programmatic requirements.    The following portions of the patient's history were reviewed and updated as appropriate: allergies, current medications, past medical history, past social history, past surgical history and problem list.  Objective:  There were no vitals filed for this visit.  Physical Exam Constitutional:      Appearance: Normal appearance.  HENT:     Head: Normocephalic.     Mouth/Throat:     Mouth: Mucous membranes are moist.     Pharynx: Oropharynx is clear. No oropharyngeal exudate.  Genitourinary:    Penis: Normal.      Testes: Normal.     Comments: No lice, nits, or pest, no lesions or odor discharge.  Denies pain or tenderness with paplation of testicles.  No lesions, ulcers or masses present.    Musculoskeletal:     Cervical back: Normal range of motion.  Lymphadenopathy:     Cervical: No cervical adenopathy.  Skin:    General: Skin is warm and dry.     Findings: No bruising, erythema, lesion or rash.  Neurological:     Mental Status: He is alert and oriented to person, place, and time.  Psychiatric:        Mood and Affect: Mood normal.        Behavior: Behavior normal.      Assessment and Plan:  Vernon Higgins is a 25 y.o. male presenting to the [facility] Department for STI screening  1. Screening examination for venereal disease  -  Gonococcus culture - Gram stain - Gonococcus culture  Patient does have STI symptoms Patient accepted all screenings including  gram  stain,  oral, urethral GC and bloodwork for HIV/RPR.  Patient meets criteria for HepB screening? Yes. Ordered? No - does not meet criteria  Patient meets criteria for HepC screening? Yes. Ordered? No - does not meet criteria  Recommended condom use with all sex Discussed importance of condom use for STI prevent  RN: Treat gram stain per standing order Discussed time line for [facility] results and that patient will be called with positive results and encouraged patient to call if he had not heard in 2 weeks Recommended returning for continued or worsening symptoms.    2. Exposure to chlamydia  RN: Treat for exposure to chlamydia with Doxycycline 100 mg Po BID x 7 days.    - doxycycline (VIBRA-TABS) 100 MG tablet; Take 1 tablet (100 mg total) by mouth 2 (two) times daily for 7 days.  Dispense: 14 tablet; Refill: 0     Return for as needed.  No future appointments.  Wendi Snipes, FNP

## 2020-09-11 NOTE — Progress Notes (Signed)
Pt states his partner has tested positive for chlamydia.

## 2020-09-11 NOTE — Progress Notes (Signed)
Gram stain reviewed, pt treated for NGU and contact to chlamydia per provider orders. Provider orders completed.

## 2020-09-15 LAB — GONOCOCCUS CULTURE

## 2022-05-24 IMAGING — CR DG SHOULDER 2+V*L*
1 series · 4 of 4 positions shown · non-contrast
Comparison: Radiograph 05/30/2019

CLINICAL DATA: Shoulder injury, popping sensation while stretching,
concern for dislocation

EXAM:
LEFT SHOULDER - 2+ VIEW

[Series 1: dg shoulder left · 0.14mm/px · 4 of 4 slices shown]
[im 1/4]
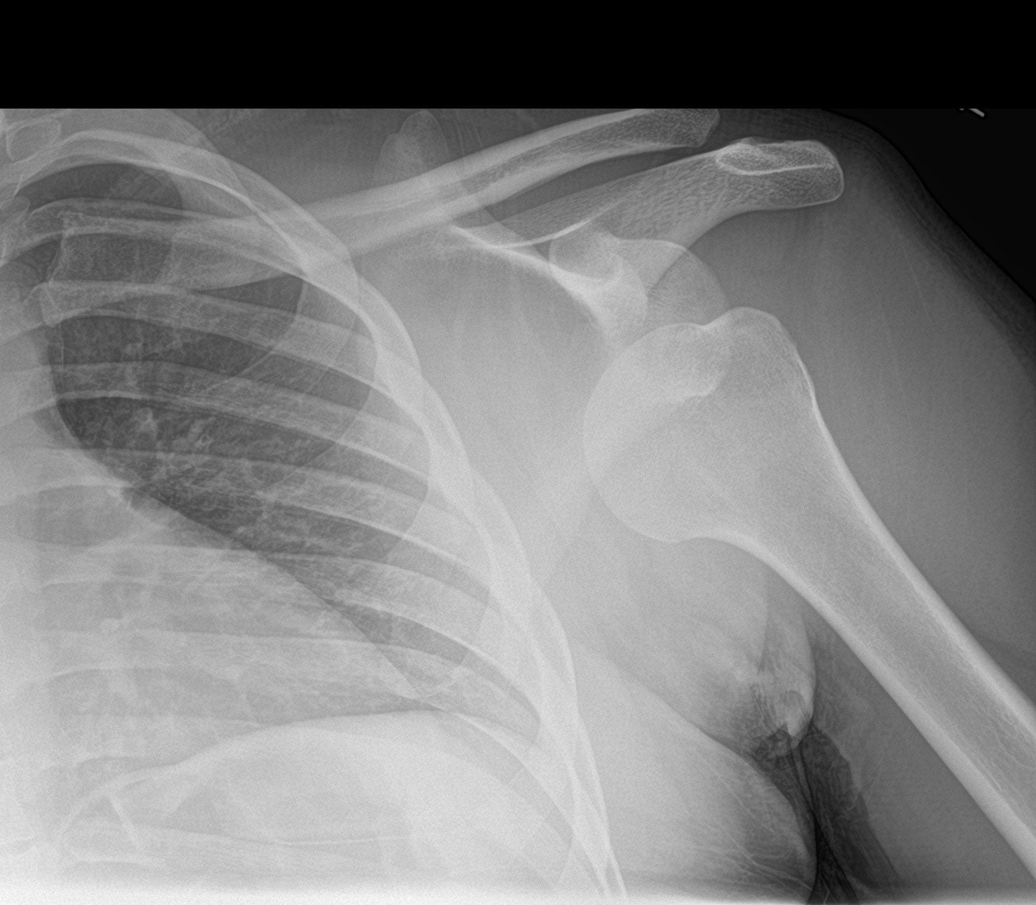
[im 2/4]
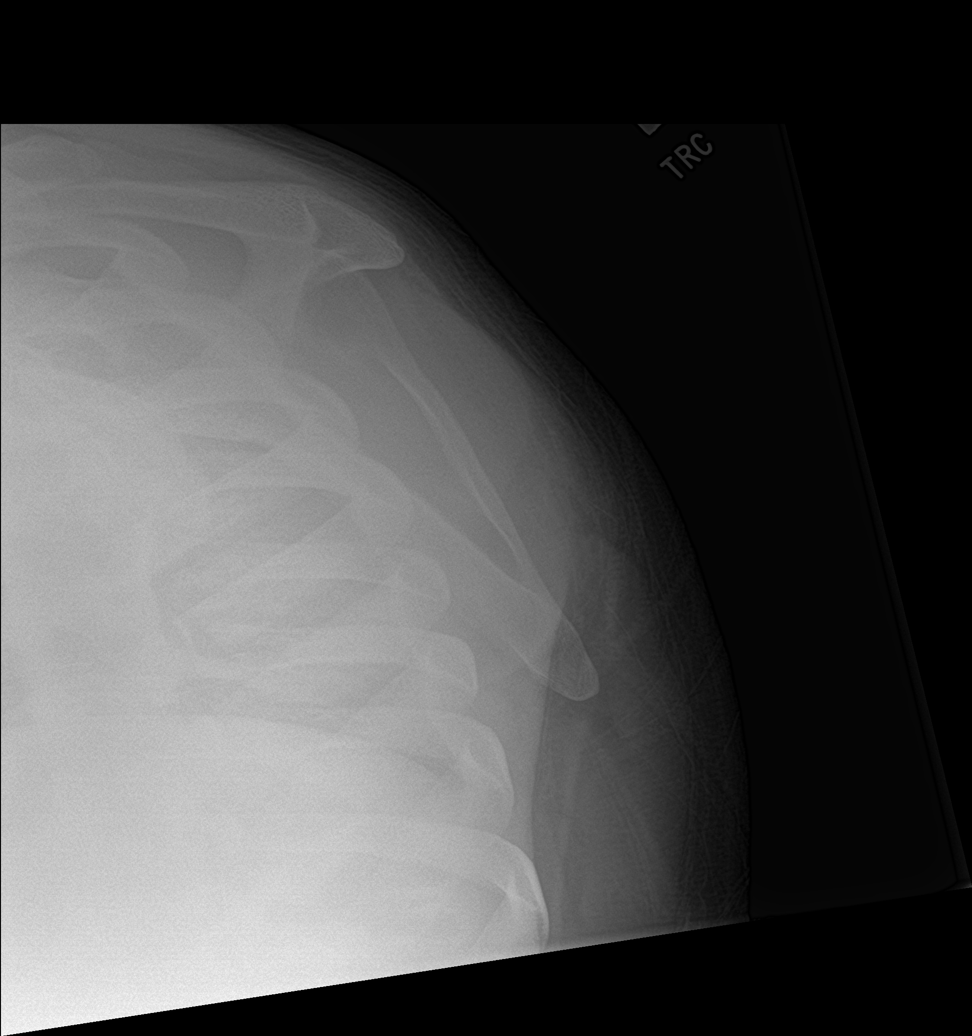
[im 3/4]
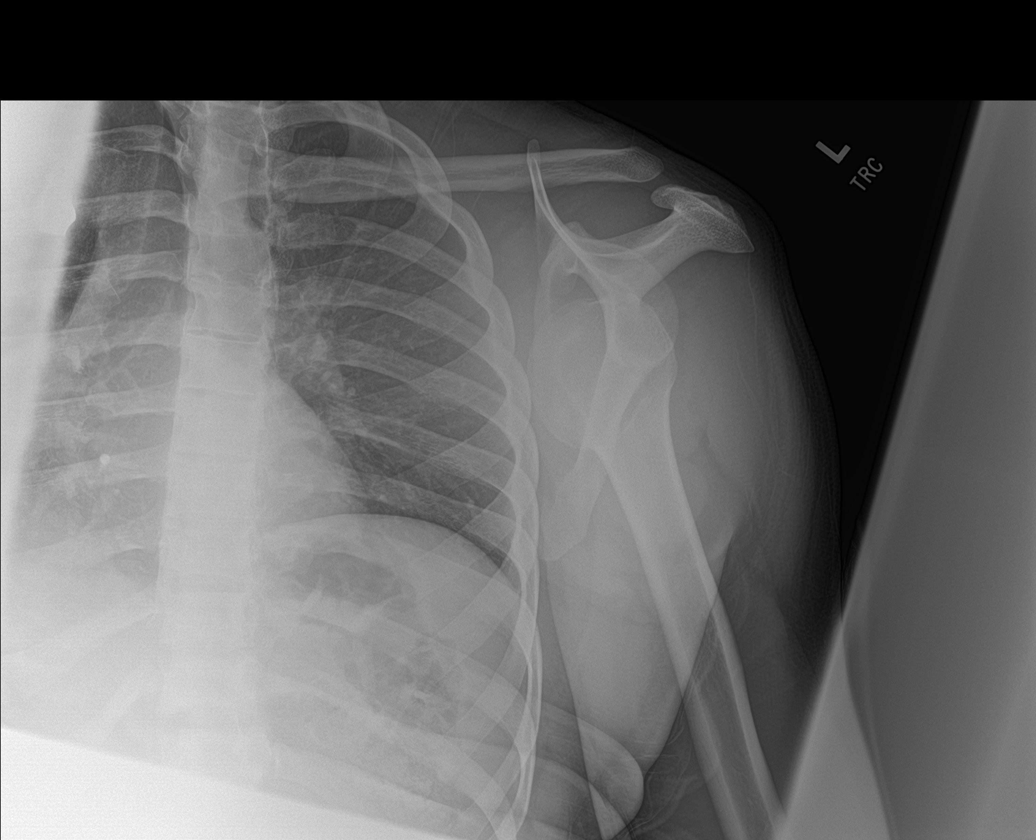
[im 4/4]
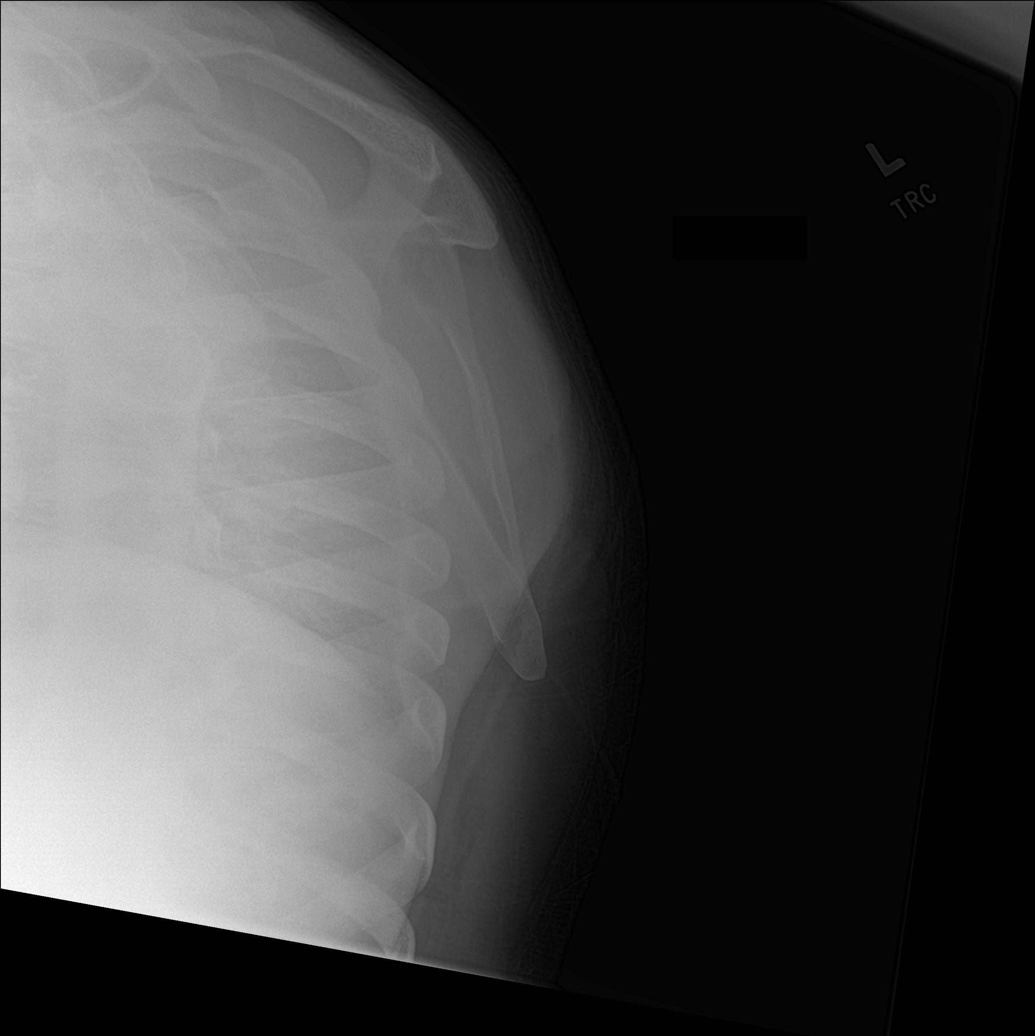

[4 of 4 positions shown; findings below may reference images not displayed]

FINDINGS: Anterior shoulder dislocation with a Hill-Sachs deformity of the
posterolateral humeral head perched upon the anteroinferior glenoid
rim. No other clear acute traumatic osseous injury is identified. No
acute traumatic abnormality of the chest wall.
IMPRESSION: Recurrent anterior shoulder dislocation with a Hill-Sachs deformity
of the posterolateral humeral head perched upon the anteroinferior
glenoid rim.

## 2023-11-09 ENCOUNTER — Ambulatory Visit: Payer: Self-pay

## 2023-11-09 DIAGNOSIS — Z113 Encounter for screening for infections with a predominantly sexual mode of transmission: Secondary | ICD-10-CM

## 2023-11-09 NOTE — Progress Notes (Signed)
 Pt here for STI screening.  No in house labs performed at today's visit.  Condoms declined.-Hasana Alcorta, RN

## 2023-11-09 NOTE — Progress Notes (Signed)
 [facility] Department STI clinic [address], [address] [place] [address] Main phone: [phone]  STI screening visit  Subjective:  Vernon Higgins is a 28 y.o. male being seen today for an STI screening visit. The patient reports they do have symptoms.    Patient has the following medical conditions:  Patient Active Problem List   Diagnosis Date Noted   Difficulty swallowing pills 01/19/2019   Chief Complaint  Patient presents with   SEXUALLY TRANSMITTED DISEASE    STI screening-notes slight tingling post urination x 2 days-no other symptoms   HPI Patient reports penile tingling post urination that lasts for a few minutes a resolves. No blisters, bumps or lesions. No penile discharge or irritation. Denies all other STI symptoms. This tingling has been occurring for ~1 week. Desires STI testing. He does not want any blood tests today.  See flowsheet for further details and programmatic requirements  Hyperlink available at the top of the signed note in blue.  Flow sheet content below:  Pregnancy Intention Screening Does the patient want to become pregnant in the next year?: No Does the patient's partner want to become pregnant in the next year?: No Would the patient like to discuss contraceptive options today?: No Reason For STD Screen STD Screening: Has symptoms Have you ever had an STD?: Yes STD Symptoms Denies all: No Genital Itching: No Lower abdominal pain: No Discharge: No Dysuria: No Genital ulcer / lesion: No Rash: No Oral / Other skin ulcer: No Pain with sex: No Sore Throat: No Visual Changes: No Other (Describe in Comments):  (tingling post urination, lasts a few mins) Abuse History Has patient ever been abused physically?: No Has patient ever been abused sexually?: No Does patient feel they have a problem with Anxiety?: No Does patient feel they have a problem with Depression?: No Counseling Patient counseled to use  condoms with all sex: Condoms declined Test results given to patient Patient counseled to use condoms with all sex: Condoms declined  Screening for MPX risk: Does the patient have an unexplained rash? No Is the patient MSM? No Does the patient endorse multiple sex partners or anonymous sex partners? No Did the patient have close or sexual contact with a person diagnosed with MPX? No Has the patient traveled outside the [place]  where MPX is endemic? No Is there a high clinical suspicion for MPX-- evidenced by one of the following No  -Unlikely to be chickenpox  -Lymphadenopathy  -Rash that present in same phase of evolution on any given body part  STI screening history: Last HIV test per patient/review of record was  Lab Results  Component Value Date   HMHIVSCREEN Negative - Validated 11/02/2019   No results found for: HIV  Last HEPC test per patient/review of record was  Lab Results  Component Value Date   HMHEPCSCREEN Negative-Validated 11/02/2019   No components found for: HEPC   Last HEPB test per patient/review of record was No components found for: HMHEPBSCREEN   Fertility: Does the patient or their partner desires a pregnancy in the next year? No   There is no immunization history on file for this patient.  The following portions of the patient's history were reviewed and updated as appropriate: allergies, current medications, past medical history, past social history, past surgical history and problem list.  Objective:  There were no vitals filed for this visit.  Physical Exam Vitals and nursing note reviewed.  Constitutional:      Appearance: Normal  appearance.  HENT:     Head: Normocephalic and atraumatic.     Mouth/Throat:     Mouth: Mucous membranes are moist.     Pharynx: No oropharyngeal exudate or posterior oropharyngeal erythema.  Eyes:     General:        Right eye: No discharge.        Left eye: No discharge.     Conjunctiva/sclera:     Right  eye: Right conjunctiva is not injected. No exudate.    Left eye: Left conjunctiva is not injected. No exudate. Pulmonary:     Effort: Pulmonary effort is normal.  Abdominal:     General: Abdomen is flat.  Genitourinary:    Comments: Declined genital exam- asymptomatic Lymphadenopathy:     Cervical: No cervical adenopathy.     Upper Body:     Right upper body: No supraclavicular or axillary adenopathy.     Left upper body: No supraclavicular or axillary adenopathy.  Skin:    General: Skin is warm and dry.  Neurological:     Mental Status: He is alert and oriented to person, place, and time.    Assessment and Plan:  Vernon Higgins is a 28 y.o. male presenting to the [facility] Department for STI screening  1. Screening for venereal disease (Primary)  - Chlamydia/GC NAA, Confirmation - Gonococcus culture  - Discussed unsure what is causing the tingling sensation. Advised him to return to clinic if any bumps, lesions, or genital sores develop. Unlikely UTI due to no increased urination, dysuria. No penile discharge. Will await g/c results.  Patient accepted the following screenings: oral GC culture and urine CT/GC Recommended condom use with all sex Discussed importance of condom use for STI prevention  Treat positive test results per standing order. Discussed time line for State Lab results and that patient will be called with positive results and encouraged patient to call if he had not heard in 2 weeks Recommended repeat testing in 3 months with positive results. Recommended returning for continued or worsening symptoms.   Return if symptoms worsen or fail to improve.  No future appointments.  Damien FORBES Satchel, NP

## 2023-11-13 ENCOUNTER — Ambulatory Visit: Payer: Self-pay

## 2023-11-13 LAB — CHLAMYDIA/GC NAA, CONFIRMATION
Chlamydia trachomatis, NAA: POSITIVE — AB
Neisseria gonorrhoeae, NAA: NEGATIVE

## 2023-11-13 LAB — C. TRACHOMATIS NAA, CONFIRM: C. trachomatis NAA, Confirm: POSITIVE — AB

## 2023-11-13 NOTE — Telephone Encounter (Signed)
 Phone call to pt at [phone]. Pt answered and confirmed identity. Counseled pt re + CT result and need for tx. Pt requested 11/19/23 appt due to work schedule.  Tx appt scheduled for 11/19/23.

## 2023-11-13 NOTE — Telephone Encounter (Signed)
 Call pt re positive chlamydia TR from 11/09/23 urine specimen. Needs tx.

## 2023-11-14 LAB — GONOCOCCUS CULTURE

## 2023-11-19 ENCOUNTER — Ambulatory Visit: Payer: Self-pay | Admitting: Family Medicine

## 2023-11-19 DIAGNOSIS — A749 Chlamydial infection, unspecified: Secondary | ICD-10-CM

## 2023-11-19 MED ORDER — DOXYCYCLINE HYCLATE 100 MG PO TABS: 100.0000 mg | ORAL_TABLET | Freq: Two times a day (BID) | ORAL | Status: AC

## 2023-11-19 NOTE — Progress Notes (Signed)
 Pt is here for Chlamydia treatment. Lab results reviewed  with patient and was dispensed Doxycycline  100 mg tablets 2x/day for 7 days.Contact card, brochure and condoms given. Kwadwo Janissa Bertram,RN.

## 2023-11-26 NOTE — Progress Notes (Signed)
 Attestation of Attending Supervision of RN:  I was consulted regarding this patient case and agree with the documentation below.   Dorothyann Helling, MD Clinical Services Medical Director [facility] Department 11/26/23  4:44 PM

## 2024-02-29 ENCOUNTER — Other Ambulatory Visit: Payer: Self-pay

## 2024-02-29 DIAGNOSIS — J111 Influenza due to unidentified influenza virus with other respiratory manifestations: Secondary | ICD-10-CM | POA: Insufficient documentation

## 2024-02-29 NOTE — ED Triage Notes (Signed)
 Pt comes with body aches for two days.

## 2024-02-29 NOTE — ED Provider Notes (Signed)
" ° °  [facility] Provider Note    Event Date/Time   First MD Initiated Contact with Patient 02/29/24 1012     (approximate)   History   Generalized Body Aches   HPI  Vernon Higgins is a 29 y.o. male who presents with complaints of bodyaches, fatigue, chills for 2 days now.  Tried to go to work today and they referred him to the emergency department for evaluation.  He denies shortness of breath.  He states he needs a work note to be off today and then he has 2 days off.     Physical Exam   Triage Vital Signs: ED Triage Vitals  Encounter Vitals Group     BP 02/29/24 1016 135/78     Girls Systolic BP Percentile --      Girls Diastolic BP Percentile --      Boys Systolic BP Percentile --      Boys Diastolic BP Percentile --      Pulse Rate 02/29/24 1016 89     Resp 02/29/24 1016 19     Temp 02/29/24 1016 98.9 F (37.2 C)     Temp src --      SpO2 02/29/24 1016 99 %     Weight --      Height --      Head Circumference --      Peak Flow --      Pain Score 02/29/24 1013 8     Pain Loc --      Pain Education --      Exclude from Growth Chart --     Most recent vital signs: Vitals:   02/29/24 1016  BP: 135/78  Pulse: 89  Resp: 19  Temp: 98.9 F (37.2 C)  SpO2: 99%     General: Awake, no distress.  Overall well-appearing no distress CV:  Good peripheral perfusion.  Resp:  Normal effort.  Clear to auscultation bilaterally Abd:  No distention.  Other:     ED Results / Procedures / Treatments   Labs (all labs ordered are listed, but only abnormal results are displayed) Labs Reviewed - No data to display   EKG     RADIOLOGY     PROCEDURES:  Critical Care performed:   Procedures   MEDICATIONS ORDERED IN ED: Medications - No data to display   IMPRESSION / MDM / ASSESSMENT AND PLAN / ED COURSE  I reviewed the triage vital signs and the nursing notes. Patient's presentation is most consistent with acute, uncomplicated  illness.  Presentation consistent with influenza, work note provided, recommend rest, outpatient follow-up as needed, return precautions discussed.  Outside the window for Tamiflu        FINAL CLINICAL IMPRESSION(S) / ED DIAGNOSES   Final diagnoses:  Influenza-like illness     Rx / DC Orders   ED Discharge Orders     None        Note:  This document was prepared using Dragon voice recognition software and may include unintentional dictation errors.   Arlander Charleston, MD 02/29/24 1331  "
